# Patient Record
Sex: Male | Born: 1965 | Race: White | Hispanic: No | Marital: Married | Smoking: Never smoker
Health system: Southern US, Community
[De-identification: ages and names within clinical notes are randomized; demographics above are authoritative.]

## PROBLEM LIST (undated history)

## (undated) DIAGNOSIS — Z86718 Personal history of other venous thrombosis and embolism: Secondary | ICD-10-CM

## (undated) DIAGNOSIS — E7800 Pure hypercholesterolemia, unspecified: Secondary | ICD-10-CM

## (undated) DIAGNOSIS — I2510 Atherosclerotic heart disease of native coronary artery without angina pectoris: Secondary | ICD-10-CM

## (undated) HISTORY — PX: FOOT SURGERY: SHX648

## (undated) HISTORY — DX: Atherosclerotic heart disease of native coronary artery without angina pectoris: I25.10

---

## 2007-07-03 ENCOUNTER — Emergency Department (HOSPITAL_COMMUNITY): Admission: EM | Admit: 2007-07-03 | Discharge: 2007-07-03 | Payer: Self-pay | Admitting: Emergency Medicine

## 2007-07-03 ENCOUNTER — Encounter (INDEPENDENT_AMBULATORY_CARE_PROVIDER_SITE_OTHER): Payer: Self-pay | Admitting: Cardiology

## 2011-10-12 LAB — POCT CARDIAC MARKERS
CKMB, poc: <1 — ABNORMAL LOW
Myoglobin, poc: 43.2
Troponin i, poc: <0.05

## 2011-10-12 LAB — COMPREHENSIVE METABOLIC PANEL
ALT: 27
AST: 23
CO2: 22
Calcium: 9.3
GFR calc Af Amer: >60
Potassium: 3.6
Sodium: 138
Total Protein: 6.9

## 2011-10-12 LAB — POCT I-STAT CREATININE: Creatinine, Ser: 0.8

## 2011-10-12 LAB — I-STAT 8, (EC8 V) (CONVERTED LAB)
Acid-base deficit: 3 — ABNORMAL HIGH
Bicarbonate: 17.2 — ABNORMAL LOW
Chloride: 109
pCO2, Ven: 20.6 — ABNORMAL LOW
pH, Ven: 7.531 — ABNORMAL HIGH

## 2011-10-12 LAB — D-DIMER, QUANTITATIVE
D-Dimer, Quant: 0.37
D-Dimer, Quant: <0.22

## 2011-10-12 LAB — CBC
HCT: 44.5
MCHC: 34.2
MCV: 90.6
Platelets: 231
RDW: 12.4

## 2011-10-12 LAB — DIFFERENTIAL
Basophils Absolute: 0
Basophils Relative: 0
Eosinophils Absolute: 0.1
Eosinophils Relative: 1
Lymphs Abs: 1.9
Neutrophils Relative %: 66

## 2011-10-12 LAB — PROTIME-INR: INR: 0.9

## 2011-10-12 LAB — CK TOTAL AND CKMB (NOT AT ARMC)
Relative Index: INVALID
Total CK: 77

## 2011-10-12 LAB — SEDIMENTATION RATE: Sed Rate: 5

## 2013-03-07 ENCOUNTER — Ambulatory Visit: Payer: Self-pay | Admitting: Family Medicine

## 2013-03-07 LAB — CBC WITH DIFFERENTIAL/PLATELET
Eosinophil #: 0.1 10*3/uL (ref 0.0–0.7)
Lymphocyte #: 1.8 10*3/uL (ref 1.0–3.6)
MCH: 30.6 pg (ref 26.0–34.0)
Neutrophil #: 12.2 10*3/uL — ABNORMAL HIGH (ref 1.4–6.5)
Neutrophil %: 78.6 %
RBC: 4.49 10*6/uL (ref 4.40–5.90)

## 2013-03-07 LAB — URINALYSIS, COMPLETE
Ketone: NEGATIVE
Ph: 6 (ref 4.5–8.0)

## 2013-03-07 LAB — COMPREHENSIVE METABOLIC PANEL
Anion Gap: 11 (ref 7–16)
Bilirubin,Total: 0.8 mg/dL (ref 0.2–1.0)
Chloride: 99 mmol/L (ref 98–107)
Glucose: 108 mg/dL — ABNORMAL HIGH (ref 65–99)
Potassium: 3.3 mmol/L — ABNORMAL LOW (ref 3.5–5.1)
SGOT(AST): 43 U/L — ABNORMAL HIGH (ref 15–37)
SGPT (ALT): 87 U/L — ABNORMAL HIGH (ref 12–78)

## 2013-03-13 ENCOUNTER — Ambulatory Visit: Payer: Self-pay

## 2013-03-15 ENCOUNTER — Ambulatory Visit: Payer: Self-pay | Admitting: Urology

## 2013-03-22 ENCOUNTER — Ambulatory Visit: Payer: Self-pay

## 2013-03-28 ENCOUNTER — Ambulatory Visit: Payer: Self-pay

## 2013-08-31 ENCOUNTER — Telehealth: Payer: Self-pay | Admitting: Endocrinology

## 2017-04-11 ENCOUNTER — Encounter: Payer: Self-pay | Admitting: *Deleted

## 2017-04-11 ENCOUNTER — Observation Stay
Admission: EM | Admit: 2017-04-11 | Discharge: 2017-04-12 | Disposition: A | Discharge status: Home / Self Care | Payer: PRIVATE HEALTH INSURANCE | Attending: Internal Medicine | Admitting: Internal Medicine

## 2017-04-11 ENCOUNTER — Emergency Department: Payer: PRIVATE HEALTH INSURANCE

## 2017-04-11 DIAGNOSIS — K219 Gastro-esophageal reflux disease without esophagitis: Secondary | ICD-10-CM | POA: Insufficient documentation

## 2017-04-11 DIAGNOSIS — Z8249 Family history of ischemic heart disease and other diseases of the circulatory system: Secondary | ICD-10-CM | POA: Insufficient documentation

## 2017-04-11 DIAGNOSIS — E7800 Pure hypercholesterolemia, unspecified: Secondary | ICD-10-CM | POA: Diagnosis not present

## 2017-04-11 DIAGNOSIS — R079 Chest pain, unspecified: Secondary | ICD-10-CM

## 2017-04-11 DIAGNOSIS — E785 Hyperlipidemia, unspecified: Secondary | ICD-10-CM | POA: Diagnosis not present

## 2017-04-11 DIAGNOSIS — R0789 Other chest pain: Secondary | ICD-10-CM | POA: Diagnosis not present

## 2017-04-11 DIAGNOSIS — R202 Paresthesia of skin: Secondary | ICD-10-CM | POA: Insufficient documentation

## 2017-04-11 DIAGNOSIS — R002 Palpitations: Secondary | ICD-10-CM | POA: Diagnosis not present

## 2017-04-11 DIAGNOSIS — I2510 Atherosclerotic heart disease of native coronary artery without angina pectoris: Secondary | ICD-10-CM | POA: Diagnosis not present

## 2017-04-11 DIAGNOSIS — I200 Unstable angina: Secondary | ICD-10-CM

## 2017-04-11 DIAGNOSIS — Z823 Family history of stroke: Secondary | ICD-10-CM | POA: Insufficient documentation

## 2017-04-11 DIAGNOSIS — E782 Mixed hyperlipidemia: Secondary | ICD-10-CM

## 2017-04-11 HISTORY — DX: Pure hypercholesterolemia, unspecified: E78.00

## 2017-04-11 LAB — BASIC METABOLIC PANEL
ANION GAP: 7 (ref 5–15)
BUN: 13 mg/dL (ref 6–20)
CALCIUM: 9.3 mg/dL (ref 8.9–10.3)
CO2: 23 mmol/L (ref 22–32)
Chloride: 106 mmol/L (ref 101–111)
Creatinine, Ser: 0.84 mg/dL (ref 0.61–1.24)
GFR calc Af Amer: >60 mL/min (ref >60)
GLUCOSE: 97 mg/dL (ref 65–99)
Potassium: 4.3 mmol/L (ref 3.5–5.1)
SODIUM: 136 mmol/L (ref 135–145)

## 2017-04-11 LAB — CBC
HCT: 45 % (ref 40.0–52.0)
HEMOGLOBIN: 15.4 g/dL (ref 13.0–18.0)
MCH: 30.8 pg (ref 26.0–34.0)
MCHC: 34.3 g/dL (ref 32.0–36.0)
MCV: 90 fL (ref 80.0–100.0)
Platelets: 228 10*3/uL (ref 150–440)
RBC: 5.01 MIL/uL (ref 4.40–5.90)
RDW: 13.2 % (ref 11.5–14.5)
WBC: 7.5 10*3/uL (ref 3.8–10.6)

## 2017-04-11 LAB — PROTIME-INR
INR: 0.89
PROTHROMBIN TIME: 12 s (ref 11.4–15.2)

## 2017-04-11 LAB — TROPONIN I: Troponin I: <0.03 ng/mL (ref <0.03)

## 2017-04-11 LAB — APTT: APTT: 29 s (ref 24–36)

## 2017-04-11 LAB — HEPARIN LEVEL (UNFRACTIONATED): HEPARIN UNFRACTIONATED: 0.17 [IU]/mL — AB (ref 0.30–0.70)

## 2017-04-11 MED ORDER — SODIUM CHLORIDE 0.9% FLUSH: 3.0000 mL | Freq: Two times a day (BID) | INTRAVENOUS | Status: DC

## 2017-04-11 MED ORDER — BISACODYL 5 MG PO TBEC: 5.0000 mg | DELAYED_RELEASE_TABLET | Freq: Every day | ORAL | Status: DC | PRN

## 2017-04-11 MED ORDER — SODIUM CHLORIDE 0.9 % IV BOLUS (SEPSIS)
1000.0000 mL | Freq: Once | INTRAVENOUS | Status: AC
  Administered 2017-04-11: 1000 mL via INTRAVENOUS

## 2017-04-11 MED ORDER — HEPARIN BOLUS VIA INFUSION
2700.0000 [IU] | Freq: Once | INTRAVENOUS | Status: AC
  Administered 2017-04-11: 2700 [IU] via INTRAVENOUS
  Filled 2017-04-11: qty 2700

## 2017-04-11 MED ORDER — HEPARIN BOLUS VIA INFUSION
4000.0000 [IU] | Freq: Once | INTRAVENOUS | Status: AC
  Administered 2017-04-11: 4000 [IU] via INTRAVENOUS
  Filled 2017-04-11: qty 4000

## 2017-04-11 MED ORDER — ASPIRIN EC 325 MG PO TBEC: 325.0000 mg | DELAYED_RELEASE_TABLET | Freq: Every day | ORAL | Status: DC

## 2017-04-11 MED ORDER — NITROGLYCERIN 0.4 MG SL SUBL
0.4000 mg | SUBLINGUAL_TABLET | SUBLINGUAL | Status: DC | PRN
  Administered 2017-04-11: 0.4 mg via SUBLINGUAL
  Filled 2017-04-11: qty 3

## 2017-04-11 MED ORDER — ONDANSETRON HCL 4 MG/2ML IJ SOLN: 4.0000 mg | Freq: Four times a day (QID) | INTRAMUSCULAR | Status: DC | PRN

## 2017-04-11 MED ORDER — METOPROLOL TARTRATE 25 MG PO TABS
12.5000 mg | ORAL_TABLET | Freq: Two times a day (BID) | ORAL | Status: DC
  Administered 2017-04-11 – 2017-04-12 (×2): 12.5 mg via ORAL
  Filled 2017-04-11 (×2): qty 1

## 2017-04-11 MED ORDER — HYDROCODONE-ACETAMINOPHEN 5-325 MG PO TABS: 1.0000 | ORAL_TABLET | ORAL | Status: DC | PRN

## 2017-04-11 MED ORDER — ACETAMINOPHEN 650 MG RE SUPP: 650.0000 mg | Freq: Four times a day (QID) | RECTAL | Status: DC | PRN

## 2017-04-11 MED ORDER — ACETAMINOPHEN 325 MG PO TABS
650.0000 mg | ORAL_TABLET | Freq: Four times a day (QID) | ORAL | Status: DC | PRN
  Administered 2017-04-12: 650 mg via ORAL

## 2017-04-11 MED ORDER — SENNOSIDES-DOCUSATE SODIUM 8.6-50 MG PO TABS: 1.0000 | ORAL_TABLET | Freq: Every evening | ORAL | Status: DC | PRN

## 2017-04-11 MED ORDER — HEPARIN (PORCINE) IN NACL 100-0.45 UNIT/ML-% IJ SOLN
1350.0000 [IU]/h | INTRAMUSCULAR | Status: DC
  Administered 2017-04-11: 1000 [IU]/h via INTRAVENOUS
  Filled 2017-04-11 (×2): qty 250

## 2017-04-11 MED ORDER — ONDANSETRON HCL 4 MG PO TABS: 4.0000 mg | ORAL_TABLET | Freq: Four times a day (QID) | ORAL | Status: DC | PRN

## 2017-04-11 MED ORDER — ENOXAPARIN SODIUM 40 MG/0.4ML ~~LOC~~ SOLN: 40.0000 mg | SUBCUTANEOUS | Status: DC

## 2017-04-11 MED ORDER — ASPIRIN 81 MG PO CHEW
324.0000 mg | CHEWABLE_TABLET | Freq: Once | ORAL | Status: AC
  Administered 2017-04-11: 324 mg via ORAL
  Filled 2017-04-11: qty 4

## 2017-04-11 MED ORDER — SIMVASTATIN 10 MG PO TABS: 20.0000 mg | ORAL_TABLET | Freq: Every day | ORAL | Status: DC

## 2017-04-11 MED ORDER — ROSUVASTATIN CALCIUM 10 MG PO TABS
10.0000 mg | ORAL_TABLET | Freq: Every day | ORAL | Status: DC
  Administered 2017-04-11 – 2017-04-12 (×2): 10 mg via ORAL
  Filled 2017-04-11 (×3): qty 1

## 2017-04-11 NOTE — Consult Note (Signed)
CARDIOLOGY CONSULT NOTE  Patient ID: Albert Burton MRN: [id number] DOB/AGE: Mar 09, 1966 51 y.o.  Admit date: 04/11/2017 Requesting Physician:  Dr. Juliene Pina Primary Physician: Mickey Farber, MD Primary Cardiologist : new Reason for Consultation unstable angina  Date:  04/11/2017    Chief Complaint  Patient presents with  . Chest Pain      History of Present Illness: Albert Burton is a 51 y.o. male who  Presented with chest pain at rest radiating to his left arm. The patient reports having cardiac catheterization more than 10 years ago at [facility] which showed 30% stenosis in one of his arteries. I could not find these records. Otherwise he is relatively healthy with only GERD and hyperlipidemia not on treatment. He is a lifelong nonsmoker. He does have family history of coronary artery disease. Over the last few months, he has experienced intermittent episodes of palpitations and tachycardia that usually causes him to wake up in the early morning hours as if he is having a nightmare. He had an episode this morning that was subsequently associated with left-sided chest heaviness and tightness with radiation to his left arm. This persisted for hours and currently he continues to have some left arm numbness and mild chest tightness. He is not aware of prior history of arrhythmia. He describes mild exertional dyspnea. His EKG showed sinus rhythm with left anterior fascicular block and nonspecific ST changes. His troponin has been negative. He is currently on heparin drip. He reports significant improvement in symptoms after he was given sublingual nitroglycerin.    Past Medical History:  Diagnosis Date  . High cholesterol     History reviewed. No pertinent surgical history.   Current Facility-Administered Medications  Medication Dose Route Frequency Provider Last Rate Last Dose  . acetaminophen (TYLENOL) tablet 650 mg  650 mg Oral Q6H PRN Adrian Saran, MD       Or  .  acetaminophen (TYLENOL) suppository 650 mg  650 mg Rectal Q6H PRN Adrian Saran, MD      . Melene Muller ON 04/12/2017] aspirin EC tablet 325 mg  325 mg Oral Daily Sital Mody, MD      . bisacodyl (DULCOLAX) EC tablet 5 mg  5 mg Oral Daily PRN Adrian Saran, MD      . heparin ADULT infusion 100 units/mL (25000 units/210mL sodium chloride 0.45%)  1,000 Units/hr Intravenous Continuous Adrian Saran, MD 10 mL/hr at 04/11/17 1501 1,000 Units/hr at 04/11/17 1501  . HYDROcodone-acetaminophen (NORCO/VICODIN) 5-325 MG per tablet 1-2 tablet  1-2 tablet Oral Q4H PRN Adrian Saran, MD      . metoprolol tartrate (LOPRESSOR) tablet 12.5 mg  12.5 mg Oral BID Sital Mody, MD      . nitroGLYCERIN (NITROSTAT) SL tablet 0.4 mg  0.4 mg Sublingual Q5 min PRN Nita Sickle, MD   0.4 mg at 04/11/17 1204  . ondansetron (ZOFRAN) tablet 4 mg  4 mg Oral Q6H PRN Adrian Saran, MD       Or  . ondansetron (ZOFRAN) injection 4 mg  4 mg Intravenous Q6H PRN Sital Mody, MD      . rosuvastatin (CRESTOR) tablet 10 mg  10 mg Oral Daily Adrian Saran, MD   10 mg at 04/11/17 1654  . senna-docusate (Senokot-S) tablet 1 tablet  1 tablet Oral QHS PRN Sital Mody, MD      . sodium chloride flush (NS) 0.9 % injection 3 mL  3 mL Intravenous Q12H Adrian Saran, MD  Allergies:   Patient has no known allergies.    Social History:  The patient  reports that he has never smoked. He has never used smokeless tobacco. He reports that he does not drink alcohol.   Family History:  The patient's remarkable for coronary artery disease and stroke.   ROS:  Please see the history of present illness.   Otherwise, review of systems are positive for none.   All other systems are reviewed and negative.    PHYSICAL EXAM: VS:  BP 135/73 (BP Location: Left Arm)   Pulse (!) 59   Temp 97.8 F (36.6 C) (Oral)   Resp 16   Ht  (1.854 m)   Wt 211 lb 12.8 oz (96.1 kg)   SpO2 100%   BMI 27.94 kg/m  , BMI Body mass index is 27.94 kg/m. GEN: Well nourished, well  developed, in no acute distress  HEENT: normal  Neck: no JVD, carotid bruits, or masses Cardiac: RRR; no murmurs, rubs, or gallops,no edema  Respiratory:  clear to auscultation bilaterally, normal work of breathing GI: soft, nontender, nondistended, + BS MS: no deformity or atrophy  Skin: warm and dry, no rash Neuro:  Strength and sensation are intact Psych: euthymic mood, full affect   EKG:   Personally reviewed by me and showed: Normal sinus rhythm with left anterior fascicular block and nonspecific ST changes. Telemetry recently reviewed by me and showed: Normal sinus rhythm.  Recent Labs: 04/11/2017: BUN 13; Creatinine, Ser 0.84; Hemoglobin 15.4; Platelets 228; Potassium 4.3; Sodium 136    Lipid Panel No results found for: CHOL, TRIG, HDL, CHOLHDL, VLDL, LDLCALC, LDLDIRECT    Wt Readings from Last 3 Encounters:  04/11/17 211 lb 12.8 oz (96.1 kg)        ASSESSMENT AND PLAN:  1.  Unstable angina: The patient's symptoms are highly worrisome for unstable angina with some nonspecific EKG changes. He also continues to have mild chest discomfort radiating to his left arm. I discussed with him different management options including proceeding with stress testing in the morning as long as enzymes remain negative versus proceeding directly with cardiac catheterization. I favor the second given his continued symptoms and I discussed the procedure in details with him as well as risks and benefits. He understands and wants to proceed with cardiac catheterization. Further recommendations to follow after cardiac cath. In the meanwhile, continue treatment with aspirin, unfractionated heparin and rosuvastatin.  2. Nighttime palpitations: If cardiac catheterization does not show obstructive disease, we should consider 30 day outpatient telemetry. Some of his nighttime symptoms might be triggered by GERD or undiagnosed sleep apnea. He denies symptoms of sleep apnea though.  Signed,  Lorine Bears, MD  04/11/2017 5:38 PM    [facility]

## 2017-04-11 NOTE — ED Notes (Signed)
Nitro relieved pt's pain, states he is still having some numbness to left arm but less severe.  MD notified.

## 2017-04-11 NOTE — ED Notes (Signed)
Patient transported to CT 

## 2017-04-11 NOTE — Progress Notes (Signed)
ANTICOAGULATION CONSULT NOTE - Initial Consult  Pharmacy Consult for Heparin Drip Indication: chest pain/ACS  No Known Allergies  Patient Measurements: Height:  (185.4 cm) Weight: 200 lb (90.7 kg) IBW/kg (Calculated) : 79.9 Heparin Dosing Weight: 90.7 kg  Vital Signs: Temp: 98.2 F (36.8 C) (04/16 1040) Temp Source: Oral (04/16 1040) BP: 109/80 (04/16 1300) Pulse Rate: 55 (04/16 1300)  Labs:  Recent Labs  04/11/17 1038  HGB 15.4  HCT 45.0  PLT 228  CREATININE 0.84  TROPONINI <0.03    Estimated Creatinine Clearance: 121.5 mL/min (by C-G formula based on SCr of 0.84 mg/dL).   Medical History: Past Medical History:  Diagnosis Date  . High cholesterol     Medications:  Infusions:  . heparin      Assessment: 51 yo male with chest pain relieved with NTG.  Goal of Therapy:  Heparin level 0.3-0.7 units/ml Monitor platelets by anticoagulation protocol: Yes   Plan:  Give 4000 units bolus x 1 Start heparin infusion at 1000 units/hr Check anti-Xa level in 6 hours and daily while on heparin Continue to monitor H&H and platelets  Jillann Charette K 04/11/2017,1:57 PM

## 2017-04-11 NOTE — Progress Notes (Addendum)
ANTICOAGULATION CONSULT NOTE - Initial Consult  Pharmacy Consult for Heparin Drip Indication: chest pain/ACS  No Known Allergies  Patient Measurements: Height:  (185.4 cm) Weight: 211 lb 12.8 oz (96.1 kg) IBW/kg (Calculated) : 79.9 Heparin Dosing Weight: 90.7 kg  Vital Signs: Temp: 98.1 F (36.7 C) (04/16 1942) Temp Source: Oral (04/16 1942) BP: 132/77 (04/16 1942) Pulse Rate: 60 (04/16 1942)  Labs:  Recent Labs  04/11/17 1038 04/11/17 1440 04/11/17 1626 04/11/17 2132  HGB 15.4  --   --   --   HCT 45.0  --   --   --   PLT 228  --   --   --   APTT  --  29  --   --   LABPROT  --  12.0  --   --   INR  --  0.89  --   --   HEPARINUNFRC  --   --   --  0.17*  CREATININE 0.84  --   --   --   TROPONINI <0.03  --  <0.03  --     Estimated Creatinine Clearance: 131.4 mL/min (by C-G formula based on SCr of 0.84 mg/dL).   Medical History: Past Medical History:  Diagnosis Date  . High cholesterol     Medications:  Infusions:  . heparin 1,000 Units/hr (04/11/17 1501)    Assessment: 51 yo male with chest pain relieved with NTG.  Goal of Therapy:  Heparin level 0.3-0.7 units/ml Monitor platelets by anticoagulation protocol: Yes   Plan:  Give 4000 units bolus x 1 Start heparin infusion at 1000 units/hr Check anti-Xa level in 6 hours and daily while on heparin Continue to monitor H&H and platelets   4/16 21:30 heparin level 0.17. 2700 unit bolus and increase rate to 1350 units/hr. Recheck heparin level and CBC with tomorrow AM labs.  4/17 AM heparin level 0.54. Recheck in 6 hours to confirm.  Albert Burton 04/11/2017,10:15 PM

## 2017-04-11 NOTE — ED Triage Notes (Signed)
States left sided chest pain that radiates under her left breast and down his left arm, states the pain woke him up in the middle of the night, states intermittent episodes of same for the past month

## 2017-04-11 NOTE — Progress Notes (Signed)
50yo wm admitted to room 231 via stretcher from ED with chest pain.  A&O x 3, no distress on ra.  Cardiac monitor placed on pt and verified with [facility] CNA, pt denies chest pain at this time but still has some numbness to left arm.  Lungs clear bil.  SL rt hand flushes well.  Heparin gtt infusing at 10u/hr in rt fa.  Skin intact and checked with Tammy RN.  Oriented to room and surroundings, POC reviewed with pt and wife.  Denies need at this time. CB in reach, SR up x2.

## 2017-04-11 NOTE — H&P (Signed)
[facility] - [place] at [facility]   PATIENT NAME: Albert Burton    MR#:  [id number]  DATE OF BIRTH:  February 10, 1966  DATE OF ADMISSION:  04/11/2017  PRIMARY CARE PHYSICIAN: Mickey Farber, MD   REQUESTING/REFERRING PHYSICIAN: *Dr Don Perking  CHIEF COMPLAINT:   Chest pain HISTORY OF PRESENT ILLNESS:  Albert Burton  is a 51 y.o. male with a known history of Hyperlipidemia not on medications yet who presents with above complaint. Patient reports at 3:30 this a.m. he will cup with palpitations and left sided chest pain under his left armpit which radiated to his left arm with a sensation of numbness. He went to work despite this ongoing symptoms and at approximately 9:30 AM he called his wife and major to ER for further evaluation. He reports that the pain was a 3 out of 4 and is constant. This is now relieved with nitroglycerin. He denies any exacerbating factors. He reports in the past several months he has had similar episodes. He reports approximately 10 years ago he underwent cardiac catheterization for similar symptoms in which they found that one of the arteries had 30% blockage.   PAST MEDICAL HISTORY:   Past Medical History:  Diagnosis Date  . High cholesterol     PAST SURGICAL HISTORY:  None  SOCIAL HISTORY:   Social History  Substance Use Topics  . Smoking status: Never Smoker  . Smokeless tobacco: Never Used  . Alcohol use No    FAMILY HISTORY:  Grandfather died age 71 after suffering 3 heart attacks  DRUG ALLERGIES:  No Known Allergies  REVIEW OF SYSTEMS:   Review of Systems  Constitutional: Negative.  Negative for chills, fever and malaise/fatigue.  HENT: Negative.  Negative for ear discharge, ear pain, hearing loss, nosebleeds and sore throat.   Eyes: Negative.  Negative for blurred vision and pain.  Respiratory: Negative.  Negative for cough, hemoptysis, shortness of breath and wheezing.   Cardiovascular: Positive for chest pain and  palpitations. Negative for leg swelling.  Gastrointestinal: Negative.  Negative for abdominal pain, blood in stool, diarrhea, nausea and vomiting.  Genitourinary: Negative.  Negative for dysuria.  Musculoskeletal: Negative.  Negative for back pain.  Skin: Negative.   Neurological: Positive for sensory change. Negative for dizziness, tremors, speech change, focal weakness, seizures and headaches.  Endo/Heme/Allergies: Negative.  Does not bruise/bleed easily.  Psychiatric/Behavioral: Negative.  Negative for depression, hallucinations and suicidal ideas.    MEDICATIONS AT HOME:   Prior to Admission medications   Medication Sig Start Date End Date Taking? Authorizing Provider  ranitidine (ZANTAC) 150 MG tablet Take 150 mg by mouth daily.   Yes Historical Provider, MD      VITAL SIGNS:  Blood pressure 109/80, pulse (!) 55, temperature 98.2 F (36.8 C), temperature source Oral, resp. rate (!) 21, height  (1.854 m), weight 90.7 kg (200 lb), SpO2 96 %.  PHYSICAL EXAMINATION:   Physical Exam  Constitutional: He is oriented to person, place, and time and well-developed, well-nourished, and in no distress. No distress.  HENT:  Head: Normocephalic.  Eyes: No scleral icterus.  Neck: Normal range of motion. Neck supple. No JVD present. No tracheal deviation present.  Cardiovascular: Normal rate, regular rhythm and normal heart sounds.  Exam reveals no gallop and no friction rub.   No murmur heard. Pulmonary/Chest: Effort normal and breath sounds normal. No respiratory distress. He has no wheezes. He has no rales. He exhibits no tenderness.  Abdominal: Soft. Bowel sounds are normal.  He exhibits no distension and no mass. There is no tenderness. There is no rebound and no guarding.  Musculoskeletal: Normal range of motion. He exhibits no edema.  Neurological: He is alert and oriented to person, place, and time.  Skin: Skin is warm. No rash noted. No erythema.  Psychiatric: Affect and  judgment normal.      LABORATORY PANEL:   CBC  Recent Labs Lab 04/11/17 1038  WBC 7.5  HGB 15.4  HCT 45.0  PLT 228   ------------------------------------------------------------------------------------------------------------------  Chemistries   Recent Labs Lab 04/11/17 1038  NA 136  K 4.3  CL 106  CO2 23  GLUCOSE 97  BUN 13  CREATININE 0.84  CALCIUM 9.3   ------------------------------------------------------------------------------------------------------------------  Cardiac Enzymes  Recent Labs Lab 04/11/17 1038  TROPONINI <0.03   ------------------------------------------------------------------------------------------------------------------  RADIOLOGY:  Dg Chest 2 View  Result Date: 04/11/2017 CLINICAL DATA:  States left sided chest pain that radiates under her left breast and down his left arm, states the pain woke him up in the middle of the night, states intermittent episodes of same for the past month. Never a smoker EXAM: CHEST  2 VIEW COMPARISON:  07/02/2017 FINDINGS: The heart size and mediastinal contours are within normal limits. Both lungs are clear. The visualized skeletal structures are unremarkable. IMPRESSION: No active cardiopulmonary disease. Electronically Signed   By: Elige Ko   On: 04/11/2017 11:13   Ct Head Wo Contrast  Result Date: 04/11/2017 CLINICAL DATA:  51 year old male with history of chest pain and left upper extremity numbness this morning. Difficulty finding words. EXAM: CT HEAD WITHOUT CONTRAST TECHNIQUE: Contiguous axial images were obtained from the base of the skull through the vertex without intravenous contrast. COMPARISON:  No priors. FINDINGS: Brain: No evidence of acute infarction, hemorrhage, hydrocephalus, extra-axial collection or mass lesion/mass effect. Vascular: No hyperdense vessel or unexpected calcification. Skull: Normal. Negative for fracture or focal lesion. Sinuses/Orbits: No acute finding. Other: None.  IMPRESSION: 1. No acute intracranial abnormalities. 2. Normal appearance of the brain. Electronically Signed   By: Trudie Reed M.D.   On: 04/11/2017 12:56    EKG:  Sinus rhythm no ST elevation or depression  IMPRESSION AND PLAN:   51 year old male with history of hyperlipidemia currently diet controlled who presents with unstable angina.  1. Unstable angina: Patient has typical symptoms which were relieved with nitroglycerin. Continue monitoring on telemetry. Continue troponins. Stress test in a.m. if troponins are negative. Consult cardiology. Full dose heparin gtt, ASA, statin. Check lipid panel and hemoglobin A1c.  2/ HLD: Diet controlled. Will start statin due to #1. Check lipid panel    All the records are reviewed and case discussed with ED provider. Management plans discussed with the patient and he is in agreement  CODE STATUS: FULL  TOTAL TIME TAKING CARE OF THIS PATIENT: 50 minutes.    Albert Burton M.D on 04/11/2017 at 1:40 PM  Between 7am to 6pm - Pager - [phone]  After 6pm go to [email or web address] - password [email or web address]  [facility] [place] [facility]  Office  [phone]  CC: Primary care physician; Mickey Farber, MD

## 2017-04-11 NOTE — Plan of Care (Signed)
Problem: Safety: Goal: Ability to remain free from injury will improve Outcome: Progressing Fall precautions in place, non skid socks when out of bed  Problem: Pain Managment: Goal: General experience of comfort will improve Outcome: Progressing Prn medications  Problem: Tissue Perfusion: Goal: Risk factors for ineffective tissue perfusion will decrease Outcome: Progressing Heparin gtt  Problem: Activity: Goal: Ability to tolerate increased activity will improve Outcome: Progressing Possible stress test tomorrow  Problem: Education: Goal: Understanding of cardiac disease, CV risk reduction, and recovery process will improve Outcome: Progressing Heparin gtt infusing

## 2017-04-11 NOTE — ED Provider Notes (Signed)
[facility] Emergency Department Provider Note  ____________________________________________  Time seen: Approximately 12:14 PM  I have reviewed the triage vital signs and the nursing notes.   HISTORY  Chief Complaint Chest Pain   HPI Albert Burton is a 51 y.o. male with no significant past medical history who presents for evaluation of left arm numbness. Patient reports that he woke up at 3 AM this morning feeling palpitations. He had difficulty finding words and developed a dull pain in his left armpit associated with numbness of his left arm. The palpitations have resolved however the numbness has persisted. Patient also reports that his difficulty finding words has resolved. He has strong family history of ischemic heart disease and strokes. Patient denies chest pain, diaphoresis, shortness of breath, lightheadedness, back pain, nausea, vomiting. Also denies numbness of his left lower extremity or facial droop. Patient does not take any medications at home. Patient reports no history of chest pain with exertion. No history of smoking.  Past Medical History:  Diagnosis Date  . High cholesterol     There are no active problems to display for this patient.   History reviewed. No pertinent surgical history.  Prior to Admission medications   Not on File    Allergies Patient has no known allergies.  No family history on file.  Social History Social History  Substance Use Topics  . Smoking status: Never Smoker  . Smokeless tobacco: Never Used  . Alcohol use No    Review of Systems  Constitutional: Negative for fever. Eyes: Negative for visual changes. ENT: Negative for sore throat. Neck: No neck pain  Cardiovascular: Negative for chest pain. Respiratory: Negative for shortness of breath. Gastrointestinal: Negative for abdominal pain, vomiting or diarrhea. Genitourinary: Negative for dysuria. Musculoskeletal: Negative for back  pain. Skin: Negative for rash. Neurological: Negative for headaches. + LUE pain numbness and difficulty finding words Psych: No SI or HI  ____________________________________________   PHYSICAL EXAM:  VITAL SIGNS: ED Triage Vitals  Enc Vitals Group     BP 04/11/17 1040 (!) 136/97     Pulse Rate 04/11/17 1040 72     Resp 04/11/17 1040 18     Temp 04/11/17 1040 98.2 F (36.8 C)     Temp Source 04/11/17 1040 Oral     SpO2 04/11/17 1040 99 %     Weight 04/11/17 1040 200 lb (90.7 kg)     Height 04/11/17 1040  (1.854 m)     Head Circumference --      Peak Flow --      Pain Score 04/11/17 1039 6     Pain Loc --      Pain Edu? --      Excl. in GC? --     Constitutional: Alert and oriented. Well appearing and in no apparent distress. HEENT:      Head: Normocephalic and atraumatic.         Eyes: Conjunctivae are normal. Sclera is non-icteric. EOMI. PERRL      Mouth/Throat: Mucous membranes are moist.       Neck: Supple with no signs of meningismus. Cardiovascular: Regular rate and rhythm. No murmurs, gallops, or rubs. 2+ symmetrical distal pulses are present in all extremities. No JVD. Respiratory: Normal respiratory effort. Lungs are clear to auscultation bilaterally. No wheezes, crackles, or rhonchi.  Gastrointestinal: Soft, non tender, and non distended with positive bowel sounds. No rebound or guarding. Musculoskeletal: Nontender with normal range of motion in all extremities.  No edema, cyanosis, or erythema of extremities. Neurologic: Normal speech and language. A & O x3, PERRL, no nystagmus, CN II-XII intact, motor testing reveals good tone and bulk throughout. There is no evidence of pronator drift or dysmetria. Muscle strength is 5/5 throughout. Deep tendon reflexes are 2+ throughout with downgoing toes. Sensory examination is intact. Gait is normal. Skin: Skin is warm, dry and intact. No rash noted. Psychiatric: Mood and affect are normal. Speech and behavior are  normal.  ____________________________________________   LABS (all labs ordered are listed, but only abnormal results are displayed)  Labs Reviewed  BASIC METABOLIC PANEL  CBC  TROPONIN I   ____________________________________________  EKG  ED ECG REPORT I, Nita Sickle, the attending physician, personally viewed and interpreted this ECG.  10:37 - normal sinus rhythm, rate of 70, normal intervals, left axis deviation, no ST elevations or depressions.  12:15 - normal sinus rhythm, rate of 61, normal intervals, T wave inversion on lead III, no STE.  ____________________________________________  RADIOLOGY  CXR: Negative  Head CT: negative ____________________________________________   PROCEDURES  Procedure(s) performed: None Procedures Critical Care performed:  None ____________________________________________   INITIAL IMPRESSION / ASSESSMENT AND PLAN / ED COURSE   51 y.o. male with no significant past medical history who presents for evaluation of left armpit pain and left arm numbness since 3AM this morning. Patient is well-appearing, in no distress, initial EKG with no evidence of ischemia. Neurologically intact. Patient was given one sublingual nitroglycerin with full resolution of his symptoms makes me concerned for ACS. however since patient mentioned difficulty finding words with the onset of symptoms at 3 AM this could also be a stroke or TIA. Outside of TPA window. Second EKG with no ischemic changes. First troponin is negative. We'll get a head CT and if that's also negative for hemorrhagic stroke given aspirin and admitted to the hospitalist service.  Clinical Course as of Apr 12 1315  Mon Apr 11, 2017  1315 CT negative. Patient remains pain free after one sublingual nitro. Will admit to Hospitalist for CP workup  [CV]    Clinical Course User Index [CV] Nita Sickle, MD    Pertinent labs & imaging results that were available during my care of the  patient were reviewed by me and considered in my medical decision making (see chart for details).    ____________________________________________   FINAL CLINICAL IMPRESSION(S) / ED DIAGNOSES  Final diagnoses:  Chest pain, unspecified type      NEW MEDICATIONS STARTED DURING THIS VISIT:  New Prescriptions   No medications on file     Note:  This document was prepared using Dragon voice recognition software and may include unintentional dictation errors.    Nita Sickle, MD 04/11/17 1316

## 2017-04-12 ENCOUNTER — Encounter
Admission: EM | Disposition: A | Discharge status: Home / Self Care | Payer: Self-pay | Source: Home / Self Care | Attending: Emergency Medicine

## 2017-04-12 ENCOUNTER — Telehealth: Payer: Self-pay | Admitting: Internal Medicine

## 2017-04-12 DIAGNOSIS — I25110 Atherosclerotic heart disease of native coronary artery with unstable angina pectoris: Secondary | ICD-10-CM

## 2017-04-12 DIAGNOSIS — R002 Palpitations: Secondary | ICD-10-CM | POA: Diagnosis not present

## 2017-04-12 DIAGNOSIS — E782 Mixed hyperlipidemia: Secondary | ICD-10-CM

## 2017-04-12 DIAGNOSIS — I200 Unstable angina: Secondary | ICD-10-CM

## 2017-04-12 HISTORY — PX: LEFT HEART CATH AND CORONARY ANGIOGRAPHY: CATH118249

## 2017-04-12 LAB — HIV ANTIBODY (ROUTINE TESTING W REFLEX): HIV SCREEN 4TH GENERATION: NONREACTIVE

## 2017-04-12 LAB — CREATININE, SERUM
Creatinine, Ser: 0.64 mg/dL (ref 0.61–1.24)
GFR calc Af Amer: >60 mL/min (ref >60)

## 2017-04-12 LAB — CBC
HCT: 43.7 % (ref 40.0–52.0)
Hemoglobin: 14.8 g/dL (ref 13.0–18.0)
MCH: 30.6 pg (ref 26.0–34.0)
MCHC: 33.9 g/dL (ref 32.0–36.0)
MCV: 90.2 fL (ref 80.0–100.0)
Platelets: 220 10*3/uL (ref 150–440)
RBC: 4.84 MIL/uL (ref 4.40–5.90)
RDW: 13.6 % (ref 11.5–14.5)
WBC: 7.9 10*3/uL (ref 3.8–10.6)

## 2017-04-12 LAB — LIPID PANEL
CHOL/HDL RATIO: 6.1 ratio
Cholesterol: 249 mg/dL — ABNORMAL HIGH (ref 0–200)
HDL: 41 mg/dL (ref >40)
LDL CALC: 170 mg/dL — AB (ref 0–99)
Triglycerides: 191 mg/dL — ABNORMAL HIGH (ref <150)
VLDL: 38 mg/dL (ref 0–40)

## 2017-04-12 LAB — HEMOGLOBIN A1C
Hgb A1c MFr Bld: 5.2 % (ref 4.8–5.6)
MEAN PLASMA GLUCOSE: 103 mg/dL

## 2017-04-12 LAB — TROPONIN I

## 2017-04-12 LAB — HEPARIN LEVEL (UNFRACTIONATED): Heparin Unfractionated: 0.54 IU/mL (ref 0.30–0.70)

## 2017-04-12 SURGERY — LEFT HEART CATH AND CORONARY ANGIOGRAPHY
Anesthesia: Moderate Sedation

## 2017-04-12 MED ORDER — ASPIRIN 81 MG PO CHEW
81.0000 mg | CHEWABLE_TABLET | Freq: Once | ORAL | Status: AC
  Administered 2017-04-12: 81 mg via ORAL
  Filled 2017-04-12: qty 1

## 2017-04-12 MED ORDER — LIDOCAINE HCL (PF) 1 % IJ SOLN
INTRAMUSCULAR | Status: DC | PRN
  Administered 2017-04-12: 3 mL

## 2017-04-12 MED ORDER — SODIUM CHLORIDE 0.9% FLUSH: 3.0000 mL | INTRAVENOUS | Status: DC | PRN

## 2017-04-12 MED ORDER — ACETAMINOPHEN 325 MG PO TABS
ORAL_TABLET | ORAL | Status: AC
  Administered 2017-04-12: 650 mg via ORAL
  Filled 2017-04-12: qty 2

## 2017-04-12 MED ORDER — LIDOCAINE HCL (PF) 1 % IJ SOLN
INTRAMUSCULAR | Status: AC
  Filled 2017-04-12: qty 30

## 2017-04-12 MED ORDER — METOPROLOL TARTRATE 25 MG PO TABS: 12.5000 mg | ORAL_TABLET | Freq: Two times a day (BID) | ORAL | 0 refills | Status: DC

## 2017-04-12 MED ORDER — ENOXAPARIN SODIUM 40 MG/0.4ML ~~LOC~~ SOLN: 40.0000 mg | SUBCUTANEOUS | Status: DC

## 2017-04-12 MED ORDER — SODIUM CHLORIDE 0.9 % IV SOLN
INTRAVENOUS | Status: DC
  Administered 2017-04-12: 10:00:00 via INTRAVENOUS

## 2017-04-12 MED ORDER — IOPAMIDOL (ISOVUE-300) INJECTION 61%
INTRAVENOUS | Status: DC | PRN
  Administered 2017-04-12: 90 mL via INTRA_ARTERIAL

## 2017-04-12 MED ORDER — MIDAZOLAM HCL 2 MG/2ML IJ SOLN
INTRAMUSCULAR | Status: AC
  Filled 2017-04-12: qty 2

## 2017-04-12 MED ORDER — SODIUM CHLORIDE 0.9 % IV SOLN: 250.0000 mL | INTRAVENOUS | Status: DC | PRN

## 2017-04-12 MED ORDER — ROSUVASTATIN CALCIUM 10 MG PO TABS: 10.0000 mg | ORAL_TABLET | Freq: Every day | ORAL | 1 refills | Status: AC

## 2017-04-12 MED ORDER — FENTANYL CITRATE (PF) 100 MCG/2ML IJ SOLN
INTRAMUSCULAR | Status: DC | PRN
  Administered 2017-04-12: 50 ug via INTRAVENOUS

## 2017-04-12 MED ORDER — HEPARIN (PORCINE) IN NACL 2-0.9 UNIT/ML-% IJ SOLN
INTRAMUSCULAR | Status: AC
  Filled 2017-04-12: qty 500

## 2017-04-12 MED ORDER — FAMOTIDINE 20 MG PO TABS
20.0000 mg | ORAL_TABLET | Freq: Every day | ORAL | Status: DC
  Administered 2017-04-12: 20 mg via ORAL
  Filled 2017-04-12: qty 1

## 2017-04-12 MED ORDER — VERAPAMIL HCL 2.5 MG/ML IV SOLN
INTRAVENOUS | Status: AC
  Filled 2017-04-12: qty 2

## 2017-04-12 MED ORDER — FENTANYL CITRATE (PF) 100 MCG/2ML IJ SOLN
INTRAMUSCULAR | Status: AC
  Filled 2017-04-12: qty 2

## 2017-04-12 MED ORDER — ASPIRIN 81 MG PO CHEW: 81.0000 mg | CHEWABLE_TABLET | Freq: Every day | ORAL | 1 refills | Status: DC

## 2017-04-12 MED ORDER — VERAPAMIL HCL 2.5 MG/ML IV SOLN
INTRAVENOUS | Status: DC | PRN
  Administered 2017-04-12: 2.5 mg via INTRAVENOUS

## 2017-04-12 MED ORDER — MIDAZOLAM HCL 2 MG/2ML IJ SOLN
INTRAMUSCULAR | Status: DC | PRN
  Administered 2017-04-12: 1 mg via INTRAVENOUS

## 2017-04-12 MED ORDER — ASPIRIN 81 MG PO CHEW: 81.0000 mg | CHEWABLE_TABLET | Freq: Every day | ORAL | Status: DC

## 2017-04-12 MED ORDER — SODIUM CHLORIDE 0.9% FLUSH: 3.0000 mL | Freq: Two times a day (BID) | INTRAVENOUS | Status: DC

## 2017-04-12 MED ORDER — SODIUM CHLORIDE 0.9 % IV SOLN: INTRAVENOUS | Status: AC

## 2017-04-12 MED ORDER — HEPARIN SODIUM (PORCINE) 1000 UNIT/ML IJ SOLN
INTRAMUSCULAR | Status: AC
  Filled 2017-04-12: qty 1

## 2017-04-12 MED ORDER — NITROGLYCERIN 0.4 MG SL SUBL: 0.4000 mg | SUBLINGUAL_TABLET | SUBLINGUAL | 0 refills | Status: AC | PRN

## 2017-04-12 MED ORDER — HEPARIN SODIUM (PORCINE) 1000 UNIT/ML IJ SOLN
INTRAMUSCULAR | Status: DC | PRN
  Administered 2017-04-12: 5000 [IU] via INTRAVENOUS

## 2017-04-12 SURGICAL SUPPLY — 8 items
CATH 5F 110X4 TIG (CATHETERS) ×3 IMPLANT
CATH 5FR PIGTAIL DIAGNOSTIC (CATHETERS) ×3 IMPLANT
CATH INFINITI 5 FR JL3.5 (CATHETERS) ×3 IMPLANT
DEVICE RAD TR BAND REGULAR (VASCULAR PRODUCTS) ×3 IMPLANT
GLIDESHEATH SLEND SS 6F .021 (SHEATH) ×3 IMPLANT
KIT MANI 3VAL PERCEP (MISCELLANEOUS) ×3 IMPLANT
PACK CARDIAC CATH (CUSTOM PROCEDURE TRAY) ×3 IMPLANT
WIRE ROSEN-J .035X260CM (WIRE) ×3 IMPLANT

## 2017-04-12 NOTE — Telephone Encounter (Signed)
tcm ph [facility] for chest pain s/p cath dr. Okey Dupre   Needs 3 weeks fu  Scheduled with End 05/03/17 at 1:20 pm

## 2017-04-12 NOTE — Plan of Care (Signed)
Problem: Safety: Goal: Ability to remain free from injury will improve Outcome: Progressing Fall precautions in place, non skid socks out of bed  Problem: Pain Managment: Goal: General experience of comfort will improve Outcome: Progressing Prn medications  Problem: Tissue Perfusion: Goal: Risk factors for ineffective tissue perfusion will decrease Outcome: Progressing Heparin gtt infusing  Problem: Cardiac: Goal: Ability to achieve and maintain adequate cardiopulmonary perfusion will improve Outcome: Progressing For heart catherization today

## 2017-04-12 NOTE — Progress Notes (Signed)
Pt returned from cath lab.  Rt wrist dressing dry and intact, no bruising.  Rt arm up on pillow, reminded pt of limited use of that rt arm, pt verbalizes understanding.  Pulses equal bilaterally.  Denies chest pain or need at this time.  Wife at bedside.

## 2017-04-12 NOTE — Progress Notes (Signed)
To cath lab via bed.

## 2017-04-12 NOTE — H&P (View-Only) (Signed)
CARDIOLOGY CONSULT NOTE  Patient ID: Albert Burton MRN: [id number] DOB/AGE: [birth year] 51 y.o.  Admit date: 04/11/2017 Requesting Physician:  Dr. Juliene Pina Primary Physician: Mickey Farber, MD Primary Cardiologist : new Reason for Consultation unstable angina  Date:  04/11/2017    Chief Complaint  Patient presents with  . Chest Pain      History of Present Illness: Albert Burton is a 51 y.o. male who  Presented with chest pain at rest radiating to his left arm. The patient reports having cardiac catheterization more than 10 years ago at [facility] which showed 30% stenosis in one of his arteries. I could not find these records. Otherwise he is relatively healthy with only GERD and hyperlipidemia not on treatment. He is a lifelong nonsmoker. He does have family history of coronary artery disease. Over the last few months, he has experienced intermittent episodes of palpitations and tachycardia that usually causes him to wake up in the early morning hours as if he is having a nightmare. He had an episode this morning that was subsequently associated with left-sided chest heaviness and tightness with radiation to his left arm. This persisted for hours and currently he continues to have some left arm numbness and mild chest tightness. He is not aware of prior history of arrhythmia. He describes mild exertional dyspnea. His EKG showed sinus rhythm with left anterior fascicular block and nonspecific ST changes. His troponin has been negative. He is currently on heparin drip. He reports significant improvement in symptoms after he was given sublingual nitroglycerin.    Past Medical History:  Diagnosis Date  . High cholesterol     History reviewed. No pertinent surgical history.   Current Facility-Administered Medications  Medication Dose Route Frequency Provider Last Rate Last Dose  . acetaminophen (TYLENOL) tablet 650 mg  650 mg Oral Q6H PRN Adrian Saran, MD       Or  .  acetaminophen (TYLENOL) suppository 650 mg  650 mg Rectal Q6H PRN Adrian Saran, MD      . Melene Muller ON 04/12/2017] aspirin EC tablet 325 mg  325 mg Oral Daily Sital Mody, MD      . bisacodyl (DULCOLAX) EC tablet 5 mg  5 mg Oral Daily PRN Adrian Saran, MD      . heparin ADULT infusion 100 units/mL (25000 units/271mL sodium chloride 0.45%)  1,000 Units/hr Intravenous Continuous Adrian Saran, MD 10 mL/hr at 04/11/17 1501 1,000 Units/hr at 04/11/17 1501  . HYDROcodone-acetaminophen (NORCO/VICODIN) 5-325 MG per tablet 1-2 tablet  1-2 tablet Oral Q4H PRN Adrian Saran, MD      . metoprolol tartrate (LOPRESSOR) tablet 12.5 mg  12.5 mg Oral BID Sital Mody, MD      . nitroGLYCERIN (NITROSTAT) SL tablet 0.4 mg  0.4 mg Sublingual Q5 min PRN Nita Sickle, MD   0.4 mg at 04/11/17 1204  . ondansetron (ZOFRAN) tablet 4 mg  4 mg Oral Q6H PRN Adrian Saran, MD       Or  . ondansetron (ZOFRAN) injection 4 mg  4 mg Intravenous Q6H PRN Sital Mody, MD      . rosuvastatin (CRESTOR) tablet 10 mg  10 mg Oral Daily Adrian Saran, MD   10 mg at 04/11/17 1654  . senna-docusate (Senokot-S) tablet 1 tablet  1 tablet Oral QHS PRN Sital Mody, MD      . sodium chloride flush (NS) 0.9 % injection 3 mL  3 mL Intravenous Q12H Adrian Saran, MD  Allergies:   Patient has no known allergies.    Social History:  The patient  reports that he has never smoked. He has never used smokeless tobacco. He reports that he does not drink alcohol.   Family History:  The patient's remarkable for coronary artery disease and stroke.   ROS:  Please see the history of present illness.   Otherwise, review of systems are positive for none.   All other systems are reviewed and negative.    PHYSICAL EXAM: VS:  BP 135/73 (BP Location: Left Arm)   Pulse (!) 59   Temp 97.8 F (36.6 C) (Oral)   Resp 16   Ht  (1.854 m)   Wt 211 lb 12.8 oz (96.1 kg)   SpO2 100%   BMI 27.94 kg/m  , BMI Body mass index is 27.94 kg/m. GEN: Well nourished, well  developed, in no acute distress  HEENT: normal  Neck: no JVD, carotid bruits, or masses Cardiac: RRR; no murmurs, rubs, or gallops,no edema  Respiratory:  clear to auscultation bilaterally, normal work of breathing GI: soft, nontender, nondistended, + BS MS: no deformity or atrophy  Skin: warm and dry, no rash Neuro:  Strength and sensation are intact Psych: euthymic mood, full affect   EKG:   Personally reviewed by me and showed: Normal sinus rhythm with left anterior fascicular block and nonspecific ST changes. Telemetry recently reviewed by me and showed: Normal sinus rhythm.  Recent Labs: 04/11/2017: BUN 13; Creatinine, Ser 0.84; Hemoglobin 15.4; Platelets 228; Potassium 4.3; Sodium 136    Lipid Panel No results found for: CHOL, TRIG, HDL, CHOLHDL, VLDL, LDLCALC, LDLDIRECT    Wt Readings from Last 3 Encounters:  04/11/17 211 lb 12.8 oz (96.1 kg)        ASSESSMENT AND PLAN:  1.  Unstable angina: The patient's symptoms are highly worrisome for unstable angina with some nonspecific EKG changes. He also continues to have mild chest discomfort radiating to his left arm. I discussed with him different management options including proceeding with stress testing in the morning as long as enzymes remain negative versus proceeding directly with cardiac catheterization. I favor the second given his continued symptoms and I discussed the procedure in details with him as well as risks and benefits. He understands and wants to proceed with cardiac catheterization. Further recommendations to follow after cardiac cath. In the meanwhile, continue treatment with aspirin, unfractionated heparin and rosuvastatin.  2. Nighttime palpitations: If cardiac catheterization does not show obstructive disease, we should consider 30 day outpatient telemetry. Some of his nighttime symptoms might be triggered by GERD or undiagnosed sleep apnea. He denies symptoms of sleep apnea though.  Signed,  Lorine Bears, MD  04/11/2017 5:38 PM    [facility]

## 2017-04-12 NOTE — Telephone Encounter (Signed)
Patient currently admitted

## 2017-04-12 NOTE — Discharge Summary (Signed)
[facility] - [place] [facility]   PATIENT NAME: Albert Burton    MR#:  [id number]  DATE OF BIRTH:  11-08-1966  DATE OF ADMISSION:  04/11/2017 ADMITTING PHYSICIAN: Adrian Saran, MD  DATE OF DISCHARGE: 04/12/17  PRIMARY CARE PHYSICIAN: THIES, DAVID, MD    ADMISSION DIAGNOSIS:  Chest pain, unspecified type [R07.9]  DISCHARGE DIAGNOSIS:  Chest pain with mild CAD on Left heart cath Hyperlipidemia  SECONDARY DIAGNOSIS:   Past Medical History:  Diagnosis Date  . High cholesterol     HOSPITAL COURSE:  51 year old male with history of hyperlipidemia currently diet controlled who presents with unstable angina.  1. Chest pain with negative troponin and no ekg changes: Patient has typical symptoms which were relieved with nitroglycerin. Pt was on Full dose heparin gtt, ASA, statin. Cardiac cath shows minimal CAD -no further w/u. Ok from cardiac standpoint to go home  2/ HLD: Diet controlled. Now on statins  lipid panel abnormal  Overall stable D/C home  CONSULTS OBTAINED:  Treatment Team:  Iran Ouch, MD  DRUG ALLERGIES:  No Known Allergies  DISCHARGE MEDICATIONS:   Current Discharge Medication List    START taking these medications   Details  aspirin 81 MG chewable tablet Chew 1 tablet (81 mg total) by mouth daily. Qty: 30 tablet, Refills: 1    metoprolol tartrate (LOPRESSOR) 25 MG tablet Take 0.5 tablets (12.5 mg total) by mouth 2 (two) times daily. Qty: 60 tablet, Refills: 0    nitroGLYCERIN (NITROSTAT) 0.4 MG SL tablet Place 1 tablet (0.4 mg total) under the tongue every 5 (five) minutes as needed for chest pain. Qty: 20 tablet, Refills: 0    rosuvastatin (CRESTOR) 10 MG tablet Take 1 tablet (10 mg total) by mouth daily. Qty: 30 tablet, Refills: 1      CONTINUE these medications which have NOT CHANGED   Details  ranitidine (ZANTAC) 150 MG tablet Take 150 mg by mouth daily.        If you experience worsening of your  admission symptoms, develop shortness of breath, life threatening emergency, suicidal or homicidal thoughts you must seek medical attention immediately by calling 911 or calling your MD immediately  if symptoms less severe.  You Must read complete instructions/literature along with all the possible adverse reactions/side effects for all the Medicines you take and that have been prescribed to you. Take any new Medicines after you have completely understood and accept all the possible adverse reactions/side effects.   Please note  You were cared for by a hospitalist during your hospital stay. If you have any questions about your discharge medications or the care you received while you were in the hospital after you are discharged, you can call the unit and asked to speak with the hospitalist on call if the hospitalist that took care of you is not available. Once you are discharged, your primary care physician will handle any further medical issues. Please note that NO REFILLS for any discharge medications will be authorized once you are discharged, as it is imperative that you return to your primary care physician (or establish a relationship with a primary care physician if you do not have one) for your aftercare needs so that they can reassess your need for medications and monitor your lab values. Today   SUBJECTIVE   Doing well  VITAL SIGNS:  Blood pressure 122/72, pulse (!) 58, temperature 97.8 F (36.6 C), temperature source Oral, resp. rate 16, height  (1.854 m),  weight 95.7 kg (211 lb), SpO2 95 %.  I/O:   Intake/Output Summary (Last 24 hours) at 04/12/17 1404 Last data filed at 04/12/17 1359  Gross per 24 hour  Intake           787.72 ml  Output              600 ml  Net           187.72 ml    PHYSICAL EXAMINATION:  GENERAL:  51 y.o.-year-old patient lying in the bed with no acute distress.  EYES: Pupils equal, round, reactive to light and accommodation. No scleral icterus.  Extraocular muscles intact.  HEENT: Head atraumatic, normocephalic. Oropharynx and nasopharynx clear.  NECK:  Supple, no jugular venous distention. No thyroid enlargement, no tenderness.  LUNGS: Normal breath sounds bilaterally, no wheezing, rales,rhonchi or crepitation. No use of accessory muscles of respiration.  CARDIOVASCULAR: S1, S2 normal. No murmurs, rubs, or gallops.  ABDOMEN: Soft, non-tender, non-distended. Bowel sounds present. No organomegaly or mass.  EXTREMITIES: No pedal edema, cyanosis, or clubbing.  NEUROLOGIC: Cranial nerves II through XII are intact. Muscle strength 5/5 in all extremities. Sensation intact. Gait not checked.  PSYCHIATRIC: The patient is alert and oriented x 3.  SKIN: No obvious rash, lesion, or ulcer.   DATA REVIEW:   CBC   Recent Labs Lab 04/12/17 0358  WBC 7.9  HGB 14.8  HCT 43.7  PLT 220    Chemistries   Recent Labs Lab 04/11/17 1038 04/12/17 0358  NA 136  --   K 4.3  --   CL 106  --   CO2 23  --   GLUCOSE 97  --   BUN 13  --   CREATININE 0.84 0.64  CALCIUM 9.3  --     Microbiology Results   No results found for this or any previous visit (from the past 240 hour(s)).  RADIOLOGY:  Dg Chest 2 View  Result Date: 04/11/2017 CLINICAL DATA:  States left sided chest pain that radiates under her left breast and down his left arm, states the pain woke him up in the middle of the night, states intermittent episodes of same for the past month. Never a smoker EXAM: CHEST  2 VIEW COMPARISON:  07/02/2017 FINDINGS: The heart size and mediastinal contours are within normal limits. Both lungs are clear. The visualized skeletal structures are unremarkable. IMPRESSION: No active cardiopulmonary disease. Electronically Signed   By: Elige Ko   On: 04/11/2017 11:13   Ct Head Wo Contrast  Result Date: 04/11/2017 CLINICAL DATA:  51 year old male with history of chest pain and left upper extremity numbness this morning. Difficulty finding words.  EXAM: CT HEAD WITHOUT CONTRAST TECHNIQUE: Contiguous axial images were obtained from the base of the skull through the vertex without intravenous contrast. COMPARISON:  No priors. FINDINGS: Brain: No evidence of acute infarction, hemorrhage, hydrocephalus, extra-axial collection or mass lesion/mass effect. Vascular: No hyperdense vessel or unexpected calcification. Skull: Normal. Negative for fracture or focal lesion. Sinuses/Orbits: No acute finding. Other: None. IMPRESSION: 1. No acute intracranial abnormalities. 2. Normal appearance of the brain. Electronically Signed   By: Trudie Reed M.D.   On: 04/11/2017 12:56     Management plans discussed with the patient, family and they are in agreement.  CODE STATUS:     Code Status Orders        Start     Ordered   04/11/17 1555  Full code  Continuous     04/11/17  1554    Code Status History    Date Active Date Inactive Code Status Order ID Comments User Context   This patient has a current code status but no historical code status.      TOTAL TIME TAKING CARE OF THIS PATIENT: 40 minutes.    Christpoher Sievers M.D on 04/12/2017 at 2:04 PM  Between 7am to 6pm - Pager - [phone] After 6pm go to [email or web address] - password [email or web address]  [facility]  Office  [phone]  CC: Primary care physician; Mickey Farber, MD

## 2017-04-12 NOTE — Progress Notes (Signed)
Pt discharged to home via wc.  Instructions  given to pt.  Questions answered.  No distress.  

## 2017-04-12 NOTE — Interval H&P Note (Signed)
History and Physical Interval Note:  04/12/2017 7:31 AM  Albert Burton  has presented today for cardiac catheterization, with the diagnosis of unstable angina. The various methods of treatment have been discussed with the patient and family. After consideration of risks, benefits and other options for treatment, the patient has consented to  Procedure(s): Left Heart Cath and Coronary Angiography and possible PCI (N/A) as a surgical intervention .  The patient's history has been reviewed, patient examined, no change in status, stable for surgery.  I have reviewed the patient's chart and labs.  Questions were answered to the patient's satisfaction.    Cath Lab Visit (complete for each Cath Lab visit)  Clinical Evaluation Leading to the Procedure:   ACS: Yes.   (unstable angina)  Non-ACS: N/A  Shaunda Tipping

## 2017-04-12 NOTE — Progress Notes (Signed)
Progress Note  Patient Name: Albert Burton Date of Encounter: 04/12/2017  Primary Cardiologist: New - Arida  Subjective   Chest pain has resolved. Patient continues to have mild numbness involving the left hand. No shortness of breath.  Inpatient Medications    Scheduled Meds: . [MAR Hold] aspirin EC  325 mg Oral Daily  . [MAR Hold] metoprolol tartrate  12.5 mg Oral BID  . [MAR Hold] rosuvastatin  10 mg Oral Daily  . [MAR Hold] sodium chloride flush  3 mL Intravenous Q12H   Continuous Infusions: . sodium chloride 10 mL/hr at 04/12/17 0942  . heparin Stopped (04/12/17 0845)   PRN Meds: [ZOX Hold] acetaminophen **OR** [MAR Hold] acetaminophen, [MAR Hold] bisacodyl, [MAR Hold] HYDROcodone-acetaminophen, [MAR Hold] nitroGLYCERIN, [MAR Hold] ondansetron **OR** [MAR Hold] ondansetron (ZOFRAN) IV, [MAR Hold] senna-docusate   Vital Signs    Vitals:   04/11/17 1942 04/12/17 0522 04/12/17 0722 04/12/17 0839  BP: 132/77 121/69 130/86   Pulse: 60 (!) 56 63 (!) 55  Resp: 16  17   Temp: 98.1 F (36.7 C)  97.5 F (36.4 C) 97.8 F (36.6 C)  TempSrc: Oral  Oral Oral  SpO2: 96% 98% 94%   Weight:    211 lb (95.7 kg)  Height:     (1.854 m)    Intake/Output Summary (Last 24 hours) at 04/12/17 1022 Last data filed at 04/12/17 0730  Gross per 24 hour  Intake          1727.72 ml  Output                0 ml  Net          1727.72 ml   Filed Weights   04/11/17 1040 04/11/17 1534 04/12/17 0839  Weight: 200 lb (90.7 kg) 211 lb 12.8 oz (96.1 kg) 211 lb (95.7 kg)    Telemetry    Normal sinus rhythm - Personally Reviewed  ECG    Normal sinus rhythm with right axis deviation and nonspecific intraventricular conduction delay - Personally Reviewed  Physical Exam   GEN: No acute distress.   Neck: No JVD Cardiac: RRR, no murmurs, rubs, or gallops.2+ radial and pedal pulses bilaterally.  Respiratory: Clear to auscultation bilaterally. GI: Soft, nontender, non-distended    MS: No edema; No deformity. Neuro:  Nonfocal  Psych: Normal affect   Labs    Chemistry Recent Labs Lab 04/11/17 1038  NA 136  K 4.3  CL 106  CO2 23  GLUCOSE 97  BUN 13  CREATININE 0.84  CALCIUM 9.3  GFRNONAA >60  GFRAA >60  ANIONGAP 7     Hematology Recent Labs Lab 04/11/17 1038 04/12/17 0358  WBC 7.5 7.9  RBC 5.01 4.84  HGB 15.4 14.8  HCT 45.0 43.7  MCV 90.0 90.2  MCH 30.8 30.6  MCHC 34.3 33.9  RDW 13.2 13.6  PLT 228 220    Cardiac Enzymes Recent Labs Lab 04/11/17 1038 04/11/17 1626 04/11/17 2132 04/12/17 0358  TROPONINI <0.03 <0.03 <0.03 <0.03   No results for input(s): TROPIPOC in the last 168 hours.   BNPNo results for input(s): BNP, PROBNP in the last 168 hours.   DDimer No results for input(s): DDIMER in the last 168 hours.   Radiology    Dg Chest 2 View  Result Date: 04/11/2017 CLINICAL DATA:  States left sided chest pain that radiates under her left breast and down his left arm, states the pain woke him up in the middle of  the night, states intermittent episodes of same for the past month. Never a smoker EXAM: CHEST  2 VIEW COMPARISON:  07/02/2017 FINDINGS: The heart size and mediastinal contours are within normal limits. Both lungs are clear. The visualized skeletal structures are unremarkable. IMPRESSION: No active cardiopulmonary disease. Electronically Signed   By: Elige Ko   On: 04/11/2017 11:13   Ct Head Wo Contrast  Result Date: 04/11/2017 CLINICAL DATA:  51 year old male with history of chest pain and left upper extremity numbness this morning. Difficulty finding words. EXAM: CT HEAD WITHOUT CONTRAST TECHNIQUE: Contiguous axial images were obtained from the base of the skull through the vertex without intravenous contrast. COMPARISON:  No priors. FINDINGS: Brain: No evidence of acute infarction, hemorrhage, hydrocephalus, extra-axial collection or mass lesion/mass effect. Vascular: No hyperdense vessel or unexpected calcification.  Skull: Normal. Negative for fracture or focal lesion. Sinuses/Orbits: No acute finding. Other: None. IMPRESSION: 1. No acute intracranial abnormalities. 2. Normal appearance of the brain. Electronically Signed   By: Trudie Reed M.D.   On: 04/11/2017 12:56    Cardiac Studies   LHC (04/12/17): 1. Mild, non-obstructive coronary artery disease with 30% lesions in the mid LCx and mid/distal RCA. 2. Normal left ventricular contraction. 3. Normal left ventricular filling pressure.  Patient Profile     51 y.o. male man with history of hyperlipidemia, admitted with left chest pain and left arm paresthesias, concerning for unstable angina.  Assessment & Plan    Unstable angina Chest pain has resolved, the left arm paresthesia persists. Troponins have been negative. Cardiac catheterization today revealed mild, nonobstructive CAD with normal LV filling pressure and contraction. Considerations for his pain include vasospasm/microvascular disease, given improvement with sublingual nitroglycerin, and noncardiac etiology.  Continue medical therapy to prevent progression of mild CAD. Would continue with indefinite low-dose aspirin and statin therapy given elevated LDL.  Continue low-dose beta blocker; could consider adding or switching to long-acting nitrate if chest pain recurs.  Recommend evaluation for noncardiac causes of the patient's symptoms. In particular, neurologic evaluation given left hand numbness should be entertained by the primary service.  Hyperlipidemia Lipid panel notable for LDL of 170 and triglycerides of 191.  Given CAD, albeit mild, recommend continued statin therapy.  Nocturnal palpitations Unclear what this represents. Given lack of obstructive CAD, outpatient monitoring should be considered. GERD and sleep apnea are also possibilities.  Obtain 30 day event monitor at the time of discharge.  Restart famotidine for empiric treatment of GERD.  Consider sleep study if  symptoms persist; this can be readdressed as an outpatient.  Signed, Yvonne Kendall, MD  04/12/2017, 10:22 AM

## 2017-04-14 NOTE — Telephone Encounter (Signed)
Patient contacted regarding discharge from [facility] on 04/12/17.   Patient understands to follow up with provider ? On 05/03/17 at 2:00pm  at [facility] with Dr End.  Patient understands discharge instructions? Yes Patient understands medications and regiment? Yes  Patient understands to bring all medications to this visit? Yes

## 2017-05-02 ENCOUNTER — Telehealth: Payer: Self-pay | Admitting: Internal Medicine

## 2017-05-02 ENCOUNTER — Encounter: Payer: Self-pay | Admitting: Emergency Medicine

## 2017-05-02 ENCOUNTER — Emergency Department
Admission: EM | Admit: 2017-05-02 | Discharge: 2017-05-02 | Disposition: A | Discharge status: Home / Self Care | Payer: PRIVATE HEALTH INSURANCE | Attending: Emergency Medicine | Admitting: Emergency Medicine

## 2017-05-02 ENCOUNTER — Emergency Department: Payer: PRIVATE HEALTH INSURANCE

## 2017-05-02 DIAGNOSIS — R202 Paresthesia of skin: Secondary | ICD-10-CM

## 2017-05-02 DIAGNOSIS — Z79899 Other long term (current) drug therapy: Secondary | ICD-10-CM | POA: Diagnosis not present

## 2017-05-02 DIAGNOSIS — R531 Weakness: Secondary | ICD-10-CM | POA: Insufficient documentation

## 2017-05-02 DIAGNOSIS — R51 Headache: Secondary | ICD-10-CM | POA: Diagnosis not present

## 2017-05-02 DIAGNOSIS — R29898 Other symptoms and signs involving the musculoskeletal system: Secondary | ICD-10-CM

## 2017-05-02 DIAGNOSIS — R42 Dizziness and giddiness: Secondary | ICD-10-CM

## 2017-05-02 DIAGNOSIS — Z7982 Long term (current) use of aspirin: Secondary | ICD-10-CM | POA: Insufficient documentation

## 2017-05-02 DIAGNOSIS — R200 Anesthesia of skin: Secondary | ICD-10-CM | POA: Insufficient documentation

## 2017-05-02 LAB — CBC
HCT: 43.7 % (ref 40.0–52.0)
HEMOGLOBIN: 15.1 g/dL (ref 13.0–18.0)
MCH: 31.4 pg (ref 26.0–34.0)
MCHC: 34.5 g/dL (ref 32.0–36.0)
MCV: 90.8 fL (ref 80.0–100.0)
PLATELETS: 202 10*3/uL (ref 150–440)
RBC: 4.81 MIL/uL (ref 4.40–5.90)
RDW: 12.9 % (ref 11.5–14.5)
WBC: 8 10*3/uL (ref 3.8–10.6)

## 2017-05-02 LAB — BASIC METABOLIC PANEL
ANION GAP: 7 (ref 5–15)
BUN: 16 mg/dL (ref 6–20)
CALCIUM: 9.3 mg/dL (ref 8.9–10.3)
CO2: 22 mmol/L (ref 22–32)
CREATININE: 0.74 mg/dL (ref 0.61–1.24)
Chloride: 109 mmol/L (ref 101–111)
Glucose, Bld: 98 mg/dL (ref 65–99)
Potassium: 4.2 mmol/L (ref 3.5–5.1)
Sodium: 138 mmol/L (ref 135–145)

## 2017-05-02 LAB — TROPONIN I

## 2017-05-02 MED ORDER — ASPIRIN EC 325 MG PO TBEC: 325.0000 mg | DELAYED_RELEASE_TABLET | Freq: Every day | ORAL | 0 refills | Status: DC

## 2017-05-02 MED ORDER — MECLIZINE HCL 25 MG PO TABS: 25.0000 mg | ORAL_TABLET | Freq: Three times a day (TID) | ORAL | 1 refills | Status: DC | PRN

## 2017-05-02 NOTE — Telephone Encounter (Signed)
Called patient and let him know Dr Serita KyleEnd's advice.  Patient said he was about to walk into the Emergency room right at that moment.

## 2017-05-02 NOTE — ED Notes (Signed)
Patient transported to MRI 

## 2017-05-02 NOTE — Telephone Encounter (Signed)
Received incoming call. Patient states he woke up Saturday with new symptoms of staggering, trying to his balance, dizziness, blurred vision. These are new symptoms that he did not have prior to or at discharge from his recent hospitalization. He has had a constant headache since then. He has a history of left hand numbness but now his whole arm feels numb, more that usual.  He also feels like he can't seem to get his words and concentrate, though, he has experienced this in the past. Denies chest pain, chest pressure, nausea, sweating or swelling. He feels very fatigued. Patient does not have a BP cuff and does not know what his BP is at this moment. Heart cath was 04/12/17.  Patient advised to call 911 to go to the ER evaluation. Patient instructed not to drive himself. He verbalized understanding. Routing to Dr End.

## 2017-05-02 NOTE — Telephone Encounter (Signed)
I agree with ED evaluation. Symptoms are not consistent with primary cardiac etiology. Recent LHC showed only mild CAD.  Yvonne Kendallhristopher Kenedy Haisley, MD [facility] Pager: [phone]

## 2017-05-02 NOTE — Telephone Encounter (Signed)
Pt states he has numbness in his left arm, no chest pain, Starting Sat he has had dizziness, everytime he gets up, consistently headaches, and blurry vision. Pt denies CP, no SOB. He asks if he should go to ED or just wait until his appt with Dr. Okey DupreEnd tomorrow. Please advise.

## 2017-05-02 NOTE — Discharge Instructions (Signed)
Your lab tests and MRI/MRA of the brain today were unremarkable. Follow up with cardiology as scheduled tomorrow, and please make an appointment with neurology as well.Return to the ER immediately if you have worsening symptoms including weakness or numbness in other parts of the body or passing out.

## 2017-05-02 NOTE — ED Triage Notes (Signed)
Pt presents to ED c/o numbness in left hand and arm with dizzy spells over the weekend. Pt was scheduled to do f/u with cardiology tomorrow; advised to come here to get everything checked out today.

## 2017-05-02 NOTE — ED Provider Notes (Signed)
[facility] Emergency Department Provider Note  ____________________________________________  Time seen: Approximately 7:54 PM  I have reviewed the triage vital signs and the nursing notes.   HISTORY  Chief Complaint Dizziness and Headache    HPI Albert Burton is a 51 y.o. male who complains ofnumbness and tingling in the left hand and forearm with dizziness that is worse with position changes. This is been present for the past 3 weeks and was initially seen in the Hospital where he had a CT scan of the head and ultimately had a cardiac catheterization done. The workup was negative, and he has follow-up with cardiology tomorrow in clinic. However, the symptoms have been constant over these past several weeks, and seemed to be worsening over the last few days. He is eating and drinking normally. No trauma. Reports some intermittent blurry vision. No vomiting. Also has some generalized headache which is mild. No focal occipital headache. No aggravating or alleviating factors for the numbness..  No chest pain the patient does report intermittent palpitations and feeling like his heart is racing.     Past Medical History:  Diagnosis Date  . High cholesterol      Patient Active Problem List   Diagnosis Date Noted  . Unstable angina (HCC)   . Palpitations   . Mixed hyperlipidemia   . Chest pain 04/11/2017     Past Surgical History:  Procedure Laterality Date  . LEFT HEART CATH AND CORONARY ANGIOGRAPHY N/A 04/12/2017   Procedure: Left Heart Cath and Coronary Angiography and possible PCI;  Surgeon: Yvonne Kendall, MD;  Location: [facility] INVASIVE CV LAB;  Service: Cardiovascular;  Laterality: N/A;     Prior to Admission medications   Medication Sig Start Date End Date Taking? Authorizing Provider  aspirin EC 325 MG tablet Take 1 tablet (325 mg total) by mouth daily. 05/02/17   Sharman Cheek, MD  meclizine (ANTIVERT) 25 MG tablet Take 1 tablet (25 mg  total) by mouth 3 (three) times daily as needed for dizziness or nausea. 05/02/17   Sharman Cheek, MD  metoprolol tartrate (LOPRESSOR) 25 MG tablet Take 0.5 tablets (12.5 mg total) by mouth 2 (two) times daily. 04/12/17   Enedina Finner, MD  nitroGLYCERIN (NITROSTAT) 0.4 MG SL tablet Place 1 tablet (0.4 mg total) under the tongue every 5 (five) minutes as needed for chest pain. 04/12/17   Enedina Finner, MD  ranitidine (ZANTAC) 150 MG tablet Take 150 mg by mouth daily.    [provider]  rosuvastatin (CRESTOR) 10 MG tablet Take 1 tablet (10 mg total) by mouth daily. 04/13/17   Enedina Finner, MD     Allergies Patient has no known allergies.   History reviewed. No pertinent family history.  Social History Social History  Substance Use Topics  . Smoking status: Never Smoker  . Smokeless tobacco: Never Used  . Alcohol use No    Review of Systems  Constitutional:   No fever or chills.  ENT:   No sore throat. No rhinorrhea. Lymphatic: No swollen glands, No extremity swelling Endocrine: No hot/cold flashes. No significant weight change. No neck swelling. Cardiovascular:   No chest pain or syncope. Respiratory:   No dyspnea or cough. Gastrointestinal:   Negative for abdominal pain, vomiting and diarrhea.  Genitourinary:   Negative for dysuria or difficulty urinating. Musculoskeletal:   Negative for focal pain or swelling Neurological:   Positive as above for headaches and numbness of the left arm.. All other systems reviewed and are negative  except as documented above in ROS and HPI.  ____________________________________________   PHYSICAL EXAM:  VITAL SIGNS: ED Triage Vitals [05/02/17 1158]  Enc Vitals Group     BP 129/86     Pulse Rate 69     Resp (!) 21     Temp 98.1 F (36.7 C)     Temp Source Oral     SpO2 98 %     Weight 200 lb (90.7 kg)     Height 6\' 1"  (1.854 m)     Head Circumference      Peak Flow      Pain Score 0     Pain Loc      Pain Edu?      Excl. in  GC?     Vital signs reviewed, nursing assessments reviewed.   Constitutional:   Alert and oriented. Well appearing and in no distress. Eyes:   No scleral icterus. No conjunctival pallor. PERRL. EOMI.  No nystagmus. ENT   Head:   Normocephalic and atraumatic.   Nose:   No congestion/rhinnorhea. No septal hematoma   Mouth/Throat:   MMM, no pharyngeal erythema. No peritonsillar mass.    Neck:   No stridor. No SubQ emphysema. No meningismus. Hematological/Lymphatic/Immunilogical:   No cervical lymphadenopathy. Cardiovascular:   RRR. Symmetric bilateral radial and DP pulses.  No murmurs.  Respiratory:   Normal respiratory effort without tachypnea nor retractions. Breath sounds are clear and equal bilaterally. No wheezes/rales/rhonchi. Gastrointestinal:   Soft and nontender. Non distended. There is no CVA tenderness.  No rebound, rigidity, or guarding. Genitourinary:   deferred Musculoskeletal:   Normal range of motion in all extremities. No joint effusions.  No lower extremity tenderness.  No edema. Neurologic:   Normal speech and language.  CN 2-10 normal. Diffuse weakness, 4 out of 5 strength in the left upper extremity. Nondermatomal subjectively diminished sensation in the left forearm and hand Positive drift on the left. Positive past pointing with the left hand on finger-to-nose Normal gait Impaired peripheral vision of the left eye NIH stroke scale 5, unclear acute versus chronic.Marland Kitchen No gross focal neurologic deficits are appreciated.  Skin:    Skin is warm, dry and intact. No rash noted.  No petechiae, purpura, or bullae.  ____________________________________________    LABS (pertinent positives/negatives) (all labs ordered are listed, but only abnormal results are displayed) Labs Reviewed  BASIC METABOLIC PANEL  CBC  TROPONIN I  URINALYSIS, COMPLETE (UACMP) WITH MICROSCOPIC   ____________________________________________   EKG  Interpreted by me Normal  sinus rhythm rate of 65, left axis, normal intervals. Poor R-wave progression in anterior precordial leads. Normal ST segments and T waves.  ____________________________________________    RADIOLOGY  Mr Angiogram Head Wo Contrast  Result Date: 05/02/2017 CLINICAL DATA:  Numbness in LEFT hand. Dizzy spells. Symptoms developed a few days ago. Possible stroke. EXAM: MRI HEAD WITHOUT CONTRAST MRA HEAD WITHOUT CONTRAST TECHNIQUE: Multiplanar, multiecho pulse sequences of the brain and surrounding structures were obtained without intravenous contrast. Angiographic images of the head were obtained using MRA technique without contrast. COMPARISON:  CT head 04/11/2017. FINDINGS: MRI HEAD FINDINGS Brain: No evidence for acute infarction, hemorrhage, mass lesion, hydrocephalus, or extra-axial fluid. Slight premature for age atrophy. No significant white matter disease. Vascular: Flow voids are maintained throughout the carotid, basilar, and vertebral arteries. There are no areas of chronic hemorrhage. Skull and upper cervical spine: Unremarkable visualized calvarium, skullbase, and cervical vertebrae. Pituitary, pineal, cerebellar tonsils unremarkable. No upper cervical cord lesions. Sinuses/Orbits:  No significant layering fluid. No acute orbital findings. Other: None. MRA HEAD FINDINGS The internal carotid arteries are widely patent. Basilar artery is widely patent with vertebrals codominant. No MCA or PCA stenosis or occlusion. Dominant LEFT A1 ACA contributes to both anterior cerebral artery supplied distally. Hypoplastic RIGHT A1 ACA. No cerebellar branch occlusion.  No intracranial saccular aneurysm. IMPRESSION: Slight premature for age atrophy.  No acute intracranial findings. No intracranial stenosis or dissection. Electronically Signed   By: Elsie StainJohn T Curnes M.D.   On: 05/02/2017 19:04   Mr Brain Wo Contrast  Result Date: 05/02/2017 CLINICAL DATA:  Numbness in LEFT hand. Dizzy spells. Symptoms developed a few  days ago. Possible stroke. EXAM: MRI HEAD WITHOUT CONTRAST MRA HEAD WITHOUT CONTRAST TECHNIQUE: Multiplanar, multiecho pulse sequences of the brain and surrounding structures were obtained without intravenous contrast. Angiographic images of the head were obtained using MRA technique without contrast. COMPARISON:  CT head 04/11/2017. FINDINGS: MRI HEAD FINDINGS Brain: No evidence for acute infarction, hemorrhage, mass lesion, hydrocephalus, or extra-axial fluid. Slight premature for age atrophy. No significant white matter disease. Vascular: Flow voids are maintained throughout the carotid, basilar, and vertebral arteries. There are no areas of chronic hemorrhage. Skull and upper cervical spine: Unremarkable visualized calvarium, skullbase, and cervical vertebrae. Pituitary, pineal, cerebellar tonsils unremarkable. No upper cervical cord lesions. Sinuses/Orbits: No significant layering fluid. No acute orbital findings. Other: None. MRA HEAD FINDINGS The internal carotid arteries are widely patent. Basilar artery is widely patent with vertebrals codominant. No MCA or PCA stenosis or occlusion. Dominant LEFT A1 ACA contributes to both anterior cerebral artery supplied distally. Hypoplastic RIGHT A1 ACA. No cerebellar branch occlusion.  No intracranial saccular aneurysm. IMPRESSION: Slight premature for age atrophy.  No acute intracranial findings. No intracranial stenosis or dissection. Electronically Signed   By: Elsie StainJohn T Curnes M.D.   On: 05/02/2017 19:04    ____________________________________________   PROCEDURES Procedures  ____________________________________________   INITIAL IMPRESSION / ASSESSMENT AND PLAN / ED COURSE  Pertinent labs & imaging results that were available during my care of the patient were reviewed by me and considered in my medical decision making (see chart for details).   Clinical Course as of May 02 1953  [facility]  16[facility] Sx c/w subacute stroke. Will get mri/mra   [PS]    Clinical Course User Index [PS] Sharman CheekStafford, Codie Hainer, MD    ----------------------------------------- 8:00 PM on 05/02/2017 -----------------------------------------  Workup negative. No evidence of stroke, aneurysm, multiple sclerosis, or other intracranial lesion. Low suspicion for meningitis or encephalitis. Given the negative workup, I suppose is possible patient has a peripheral neuropathy or cervical radiculopathy at the same time is also having vertigo from inner ear dysfunction. He has follow-up with cardiology tomorrow which I encouraged him to keep. Also instructed the patient to follow up with neurology for further assessment. Increase aspirin to full dose 325 daily. Return precautions given.   ____________________________________________   FINAL CLINICAL IMPRESSION(S) / ED DIAGNOSES  Final diagnoses:  Dizziness  Arm paresthesia, left  Left arm weakness      New Prescriptions   ASPIRIN EC 325 MG TABLET    Take 1 tablet (325 mg total) by mouth daily.   MECLIZINE (ANTIVERT) 25 MG TABLET    Take 1 tablet (25 mg total) by mouth 3 (three) times daily as needed for dizziness or nausea.     Portions of this note were generated with dragon dictation software. Dictation errors may occur despite best attempts at proofreading.  Sharman Cheek, MD 05/02/17 2001

## 2017-05-03 ENCOUNTER — Encounter: Payer: Self-pay | Admitting: Internal Medicine

## 2017-05-03 ENCOUNTER — Ambulatory Visit (INDEPENDENT_AMBULATORY_CARE_PROVIDER_SITE_OTHER): Payer: PRIVATE HEALTH INSURANCE | Admitting: Internal Medicine

## 2017-05-03 VITALS — BP 120/86 | HR 61 | Ht 73.0 in | Wt 204.2 lb

## 2017-05-03 DIAGNOSIS — I2510 Atherosclerotic heart disease of native coronary artery without angina pectoris: Secondary | ICD-10-CM | POA: Diagnosis not present

## 2017-05-03 DIAGNOSIS — R202 Paresthesia of skin: Secondary | ICD-10-CM | POA: Diagnosis not present

## 2017-05-03 DIAGNOSIS — R002 Palpitations: Secondary | ICD-10-CM | POA: Diagnosis not present

## 2017-05-03 DIAGNOSIS — R5383 Other fatigue: Secondary | ICD-10-CM | POA: Diagnosis not present

## 2017-05-03 DIAGNOSIS — E782 Mixed hyperlipidemia: Secondary | ICD-10-CM

## 2017-05-03 DIAGNOSIS — R42 Dizziness and giddiness: Secondary | ICD-10-CM

## 2017-05-03 NOTE — Patient Instructions (Addendum)
Medication Instructions:  Your physician recommends that you continue on your current medications as directed. Please refer to the Current Medication list given to you today.   Labwork: none  Testing/Procedures: Your physician has requested that you have an echocardiogram. Echocardiography is a painless test that uses sound waves to create images of your heart. It provides your doctor with information about the size and shape of your heart and how well your heart's chambers and valves are working. This procedure takes approximately one hour. There are no restrictions for this procedure.  IN THE NEXT 1-2 WEEKS PLEASE.  Your physician has recommended that you wear an 14 DAY ZIO PATCH event monitor. Event monitors are medical devices that record the heart's electrical activity. Doctors most often us these monitors to diagnose arrhythmias. Arrhythmias are problems with the speed or rhythm of the heartbeat. The monitor is a small, portable device. You can wear one while you do your normal daily activities. This is usually used to diagnose what is causing palpitations/syncope (passing out).  TO BE PLACED AFTER THE ECHO HAS BEEN PERFORMED.    Follow-Up: Your physician recommends that you schedule a follow-up appointment in: 6 WEEKS WITH DR END.   If you need a refill on your cardiac medications before your next appointment, please call your pharmacy.  Echocardiogram An echocardiogram, or echocardiography, uses sound waves (ultrasound) to produce an image of your heart. The echocardiogram is simple, painless, obtained within a short period of time, and offers valuable information to your health care provider. The images from an echocardiogram can provide information such as:  Evidence of coronary artery disease (CAD).  Heart size.  Heart muscle function.  Heart valve function.  Aneurysm detection.  Evidence of a past heart attack.  Fluid buildup around the heart.  Heart muscle  thickening.  Assess heart valve function. Tell a health care provider about:  Any allergies you have.  All medicines you are taking, including vitamins, herbs, eye drops, creams, and over-the-counter medicines.  Any problems you or family members have had with anesthetic medicines.  Any blood disorders you have.  Any surgeries you have had.  Any medical conditions you have.  Whether you are pregnant or may be pregnant. What happens before the procedure? No special preparation is needed. Eat and drink normally. What happens during the procedure?  In order to produce an image of your heart, gel will be applied to your chest and a wand-like tool (transducer) will be moved over your chest. The gel will help transmit the sound waves from the transducer. The sound waves will harmlessly bounce off your heart to allow the heart images to be captured in real-time motion. These images will then be recorded.  You may need an IV to receive a medicine that improves the quality of the pictures. What happens after the procedure? You may return to your normal schedule including diet, activities, and medicines, unless your health care provider tells you otherwise. This information is not intended to replace advice given to you by your health care provider. Make sure you discuss any questions you have with your health care provider. Document Released: 12/10/2000 Document Revised: 07/31/2016 Document Reviewed: 08/20/2013 Elsevier Interactive Patient Education  2017 [employer]    Cardiac Event Monitoring A cardiac event monitor is a small recording device that is used to detect abnormal heart rhythms (arrhythmias). The monitor is used to record your heart rhythm when you have symptoms, such as:  Fast heartbeats (palpitations), such as heart racing  or fluttering.  Dizziness.  Fainting or light-headedness.  Unexplained weakness. Some monitors are wired to electrodes placed on your chest.  Electrodes are flat, sticky disks that attach to your skin. Other monitors may be hand-held or worn on the wrist. The monitor can be worn for up to 30 days. If the monitor is attached to your chest, a technician will prepare your chest for the electrode placement and show you how to work the monitor. Take time to practice using the monitor before you leave the office. Make sure you understand how to send the information from the monitor to your health care provider. In some cases, you may need to use a landline telephone instead of a cell phone. What are the risks? Generally, this device is safe to use, but it possible that the skin under the electrodes will become irritated. How to use your cardiac event monitor  Wear your monitor at all times, except when you are in water:  Do not let the monitor get wet.  Take the monitor off when you bathe. Do not swim or use a hot tub with it on.  Keep your skin clean. Do not put body lotion or moisturizer on your chest.  Change the electrodes as told by your health care provider or any time they stop sticking to your skin. You may need to use medical tape to keep them on.  Try to put the electrodes in slightly different places on your chest to help prevent skin irritation. They must remain in the area under your left breast and in the upper right section of your chest.  Make sure the monitor is safely clipped to your clothing or in a location close to your body that your health care provider recommends.  Press the button to record as soon as you feel heart-related symptoms, such as:  Dizziness.  Weakness.  Light-headedness.  Palpitations.  Thumping or pounding in your chest.  Shortness of breath.  Unexplained weakness.  Keep a diary of your activities, such as walking, doing chores, and taking medicine. It is very important to note what you were doing when you pushed the button to record your symptoms. This will help your health care  provider determine what might be contributing to your symptoms.  Send the recorded information as recommended by your health care provider. It may take some time for your health care provider to process the results.  Change the batteries as told by your health care provider.  Keep electronic devices away from your monitor. This includes:  Tablets.  MP3 players.  Cell phones.  While wearing your monitor you should avoid:  Electric blankets.  Firefighter.  Electric toothbrushes.  Microwave ovens.  Magnets.  Metal detectors. Get help right away if:  You have chest pain.  You have extreme difficulty breathing or shortness of breath.  You develop a very fast heartbeat that persists.  You develop dizziness that does not go away.  You faint or constantly feel like you are about to faint. Summary  A cardiac event monitor is a small recording device that is used to help detect abnormal heart rhythms (arrhythmias).  The monitor is used to record your heart rhythm when you have heart-related symptoms.  Make sure you understand how to send the information from the monitor to your health care provider.  It is important to press the button on the monitor when you have any heart-related symptoms.  Keep a diary of your activities, such as walking, doing  chores, and taking medicine. It is very important to note what you were doing when you pushed the button to record your symptoms. This will help your health care provider learn what might be causing your symptoms. This information is not intended to replace advice given to you by your health care provider. Make sure you discuss any questions you have with your health care provider. Document Released: 09/21/2008 Document Revised: 11/27/2016 Document Reviewed: 11/27/2016 Elsevier Interactive Patient Education  2017 [employer].

## 2017-05-03 NOTE — Progress Notes (Signed)
Follow-up Outpatient Visit Date: 05/03/2017  Primary Care Provider: Ezequiel Kayser, MD [place] [address]  Chief Complaint: Dizziness and left arm numbness  HPI:  Albert Burton is a 51 y.o. year-old male with history of hyperlipidemia, who presents for follow-up of dizziness and left arm numbness. I first met him during a hospitalization last month, which time he awoke with chest pain and left arm paresthesias. He also noted intermittent palpitations during this episode. He was admitted for unstable angina and underwent left heart catheterization that showed mild, nonobstructive CAD. His chest pain spontaneously resolved, though he has continued to have frequent episodes of numbness in the left hand. Albert Burton also endorses frequent dizziness, that he describes as feeling off balance. He has not fallen. The dizziness is sometimes orthostatic and positional, though it can also occur when he has been still for some time. He notes significant fatigue over the last few weeks. He also experiences racing of the heart, most noticeable right after he has eaten. He presented to the emergency department yesterday for evaluation of his dizziness and left arm paresthesias. Workup, including MRI/MRA of the brain/head was unrevealing.  During recent hospitalization, Albert Burton was started on low-dose metoprolol. He developed a pruritic rash that resolved after discontinuation of the medication.  --------------------------------------------------------------------------------------------------  Cardiovascular History & Procedures: Cardiovascular Problems:  Nonobstructive coronary artery disease  Palpitations  Dizziness  Risk Factors:  Hyperlipidemia and male gender  Cath/PCI:  LHC (04/12/17): Mild, nonobstructive CAD with 30% mid LCx and mid/distal RCA lesions. Normal left ventricular contraction. Normal left ventricular filling pressure.  CV  Surgery:  None  EP Procedures and Devices:  None  Non-Invasive Evaluation(s):  None  Recent CV Pertinent Labs: Lab Results  Component Value Date   CHOL 249 (H) 04/12/2017   HDL 41 04/12/2017   LDLCALC 170 (H) 04/12/2017   TRIG 191 (H) 04/12/2017   CHOLHDL 6.1 04/12/2017   INR 0.89 04/11/2017   K 4.2 05/02/2017   K 3.3 (L) 03/07/2013   MG 1.9 07/03/2007   BUN 16 05/02/2017   BUN 19 (H) 03/07/2013   CREATININE 0.74 05/02/2017   CREATININE 1.52 (H) 03/07/2013    Past medical and surgical history were reviewed and updated in EPIC.  Outpatient Encounter Prescriptions as of 05/03/2017  Medication Sig  . aspirin EC 325 MG tablet Take 1 tablet (325 mg total) by mouth daily.  . meclizine (ANTIVERT) 25 MG tablet Take 1 tablet (25 mg total) by mouth 3 (three) times daily as needed for dizziness or nausea.  . nitroGLYCERIN (NITROSTAT) 0.4 MG SL tablet Place 1 tablet (0.4 mg total) under the tongue every 5 (five) minutes as needed for chest pain.  . ranitidine (ZANTAC) 150 MG tablet Take 150 mg by mouth daily.  . rosuvastatin (CRESTOR) 10 MG tablet Take 1 tablet (10 mg total) by mouth daily.  . [DISCONTINUED] metoprolol tartrate (LOPRESSOR) 25 MG tablet Take 0.5 tablets (12.5 mg total) by mouth 2 (two) times daily. (Patient not taking: Reported on 05/03/2017)   No facility-administered encounter medications on file as of 05/03/2017.     Allergies: Metoprolol  Social History   Social History  . Marital status: Married    Spouse name: N/A  . Number of children: N/A  . Years of education: N/A   Occupational History  . Not on file.   Social History Main Topics  . Smoking status: Never Smoker  . Smokeless tobacco: Never Used  .  Alcohol use No  . Drug use: No  . Sexual activity: Not on file   Other Topics Concern  . Not on file   Social History Narrative  . No narrative on file    Family History  Problem Relation Age of Onset  . Hyperlipidemia Maternal Grandmother   .  Heart attack Maternal Grandfather   . Stroke Paternal Grandfather     Review of Systems: A 12-system review of systems was performed and was negative except as noted in the HPI.  --------------------------------------------------------------------------------------------------  Physical Exam: BP 120/86 (BP Location: Right Arm, Patient Position: Sitting, Cuff Size: Normal)   Pulse 61   Ht '6\' 1"'$  (1.854 m)   Wt 204 lb 4 oz (92.6 kg)   BMI 26.95 kg/m   Position Blood pressure (mmHg) Heart rate (bpm)  Lying 116/70  64   Sitting 130/80  67   Standing 126/78  70   Standing (3 minutes) 124/86  74    General:  Well-developed, well-nourished man, lying comfortably on the exam table. HEENT: No conjunctival pallor or scleral icterus.  Moist mucous membranes.  OP clear. Neck: Supple without lymphadenopathy, thyromegaly, JVD, or HJR.  No carotid bruit. Lungs: Normal work of breathing.  Clear to auscultation bilaterally without wheezes or crackles. Heart: Regular rate and rhythm without murmurs, rubs, or gallops.  Non-displaced PMI. Abd: Bowel sounds present.  Soft, NT/ND without hepatosplenomegaly Ext: No lower extremity edema.  Radial, PT, and DP pulses are 2+ bilaterally. Right radial arteriotomy site is well-healed. Skin: warm and dry without rash  EKG:  Normal sinus rhythm with single PVC. Borderline nonspecific intraventricular conduction delay (QRS 120 ms). PVC is new since 04/11/17. Otherwise, there has been no significant interval change..  Lab Results  Component Value Date   WBC 8.0 05/02/2017   HGB 15.1 05/02/2017   HCT 43.7 05/02/2017   MCV 90.8 05/02/2017   PLT 202 05/02/2017    Lab Results  Component Value Date   NA 138 05/02/2017   K 4.2 05/02/2017   CL 109 05/02/2017   CO2 22 05/02/2017   BUN 16 05/02/2017   CREATININE 0.74 05/02/2017   GLUCOSE 98 05/02/2017   ALT 87 (H) 03/07/2013    Lab Results  Component Value Date   CHOL 249 (H) 04/12/2017   HDL 41  04/12/2017   LDLCALC 170 (H) 04/12/2017   TRIG 191 (H) 04/12/2017   CHOLHDL 6.1 04/12/2017   --------------------------------------------------------------------------------------------------  ASSESSMENT AND PLAN: Nonobstructive coronary artery disease Albert Burton's not had any recurrence of chest pain. Heart catheterization last month showed mild CAD involving the LCx and RCA. He was intolerant of metoprolol due to a rash with itching. He remains on aspirin and rosuvastatin, which he is tolerating well. We will continue with primary prevention. No further workup at this time.  Dizziness, fatigue, palpitations, and left arm paresthesia Symptoms are nonspecific and have both an orthostatic component and potentially an element of vertigo. MRI the ED yesterday showed slightly premature age atrophy. No acute intracranial findings were identified. Orthostatic vital signs today are normal. Given intermittent palpitations, though not clearly related to his dizziness, we have agreed to obtain a transthoracic echocardiogram and 14-day event monitor. I have also advised Albert Burton to see a neurologist for further evaluation. I will defer the decision to obtain carotid Dopplers to the neurology consultation.  Hyperlipidemia Recent lipid panel was notable for an LDL of 170 and triglycerides of 191. Given CAD, albeit mild, we will continue with rosuvastatin.  We will plan for repeat lipid panel in about 3 months to assess response.  Follow-up: Return to clinic in 6 weeks.  Nelva Bush, MD 05/04/2017 1:14 PM

## 2017-05-04 ENCOUNTER — Encounter: Payer: Self-pay | Admitting: Internal Medicine

## 2017-05-04 DIAGNOSIS — I2510 Atherosclerotic heart disease of native coronary artery without angina pectoris: Secondary | ICD-10-CM | POA: Insufficient documentation

## 2017-05-12 ENCOUNTER — Other Ambulatory Visit: Payer: PRIVATE HEALTH INSURANCE

## 2017-05-13 ENCOUNTER — Other Ambulatory Visit: Payer: Self-pay | Admitting: Neurology

## 2017-05-13 DIAGNOSIS — I639 Cerebral infarction, unspecified: Secondary | ICD-10-CM

## 2017-05-25 ENCOUNTER — Ambulatory Visit
Admission: RE | Admit: 2017-05-25 | Discharge: 2017-05-25 | Disposition: A | Discharge status: Home / Self Care | Payer: PRIVATE HEALTH INSURANCE | Source: Ambulatory Visit | Attending: Neurology | Admitting: Neurology

## 2017-05-25 DIAGNOSIS — G319 Degenerative disease of nervous system, unspecified: Secondary | ICD-10-CM | POA: Diagnosis not present

## 2017-05-25 DIAGNOSIS — I639 Cerebral infarction, unspecified: Secondary | ICD-10-CM

## 2017-05-25 DIAGNOSIS — H9393 Unspecified disorder of ear, bilateral: Secondary | ICD-10-CM | POA: Insufficient documentation

## 2017-05-25 MED ORDER — GADOBENATE DIMEGLUMINE 529 MG/ML IV SOLN
20.0000 mL | Freq: Once | INTRAVENOUS | Status: AC | PRN
  Administered 2017-05-25: 19 mL via INTRAVENOUS

## 2017-06-17 ENCOUNTER — Other Ambulatory Visit: Payer: Self-pay | Admitting: Internal Medicine

## 2017-06-17 DIAGNOSIS — R42 Dizziness and giddiness: Secondary | ICD-10-CM

## 2017-06-17 DIAGNOSIS — R5383 Other fatigue: Secondary | ICD-10-CM

## 2017-06-21 ENCOUNTER — Other Ambulatory Visit: Payer: PRIVATE HEALTH INSURANCE

## 2017-06-22 ENCOUNTER — Ambulatory Visit: Payer: PRIVATE HEALTH INSURANCE | Admitting: Internal Medicine

## 2017-07-07 ENCOUNTER — Encounter: Payer: Self-pay | Admitting: Internal Medicine

## 2017-08-02 ENCOUNTER — Encounter: Payer: Self-pay | Admitting: Internal Medicine

## 2018-10-17 IMAGING — CR DG CHEST 2V
1 series · 2 of 2 positions shown · non-contrast
Comparison: 07/02/2017

CLINICAL DATA: States left sided chest pain that radiates under her
left breast and down his left arm, states the pain woke him up in
the middle of the night, states intermittent episodes of same for
the past month. Never a smoker

EXAM:
CHEST  2 VIEW

[Series 1: dg chest 2 view · 0.14mm/px · 2 of 2 slices shown]
[im 1/2]
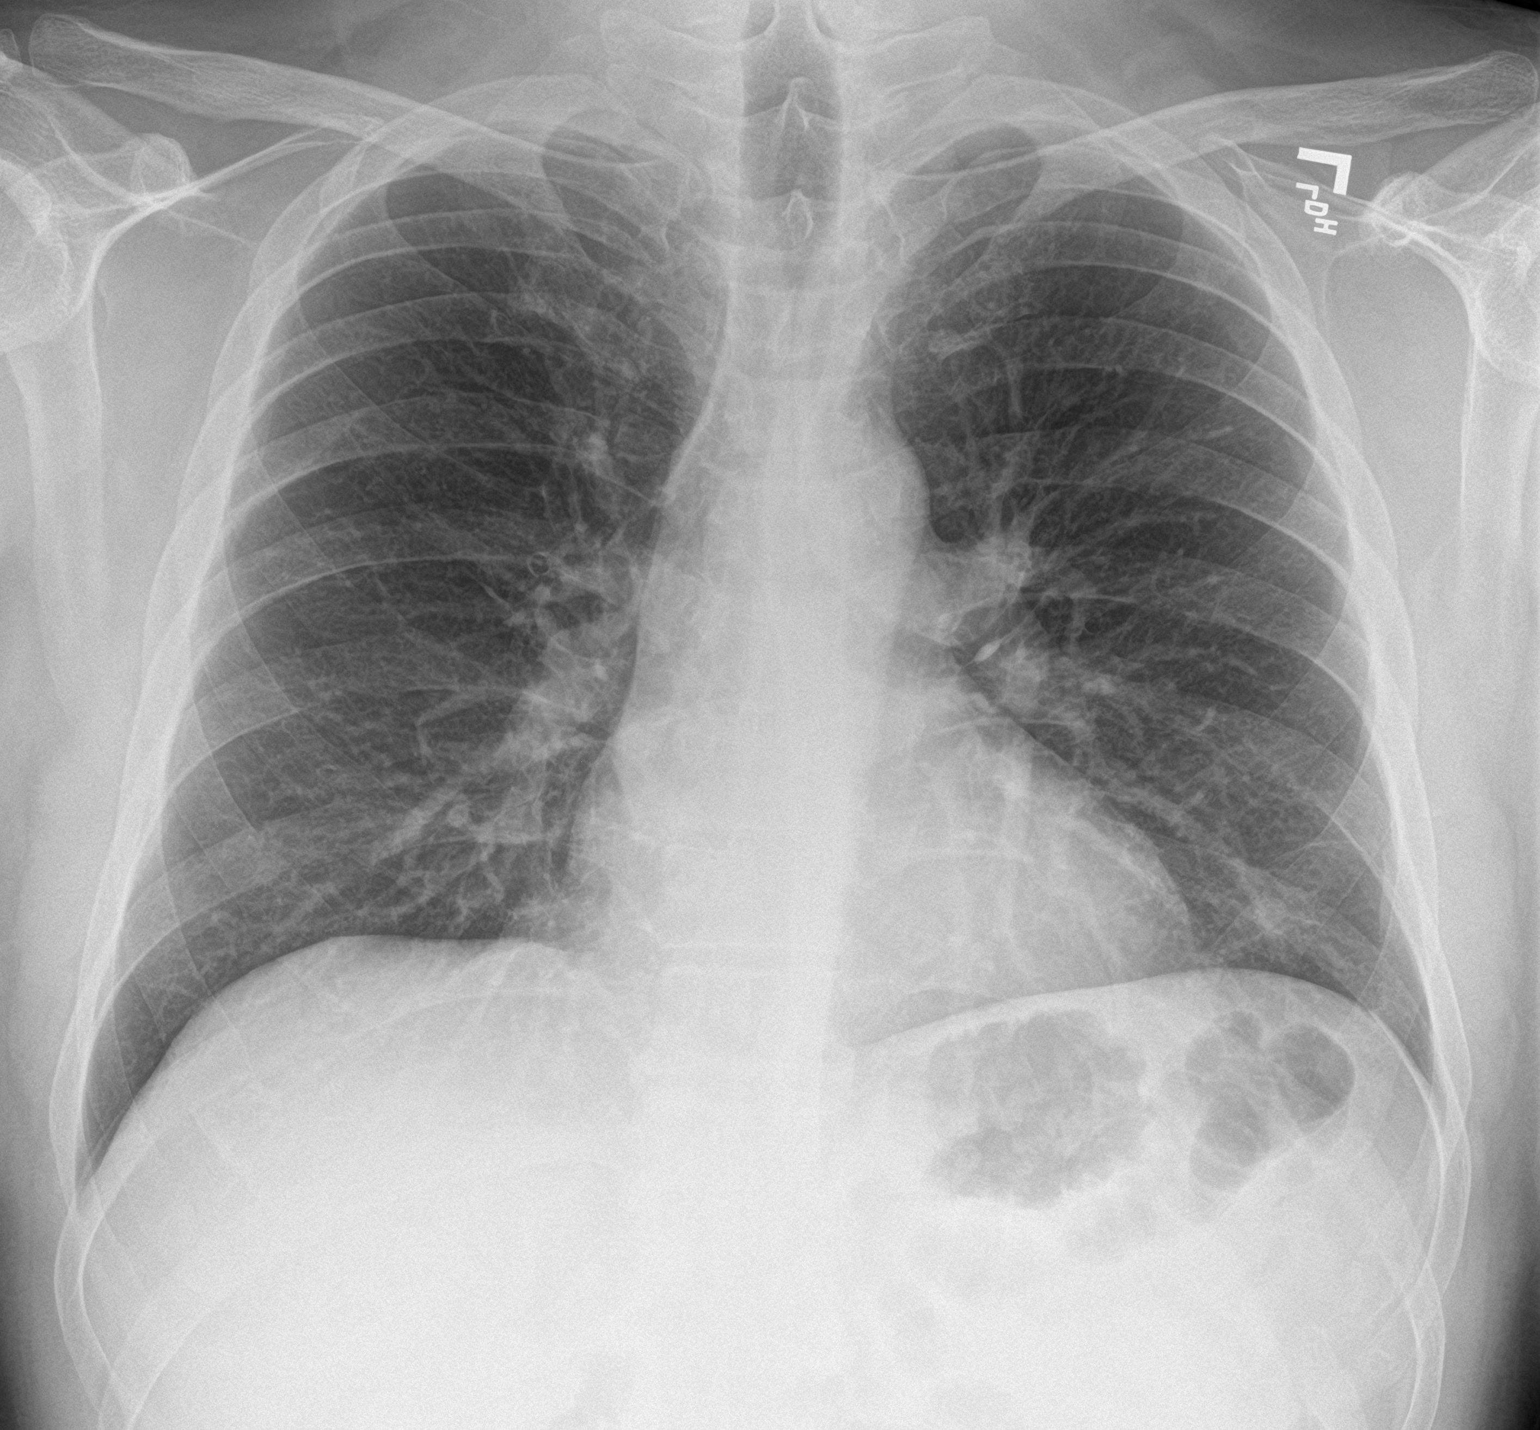
[im 2/2]
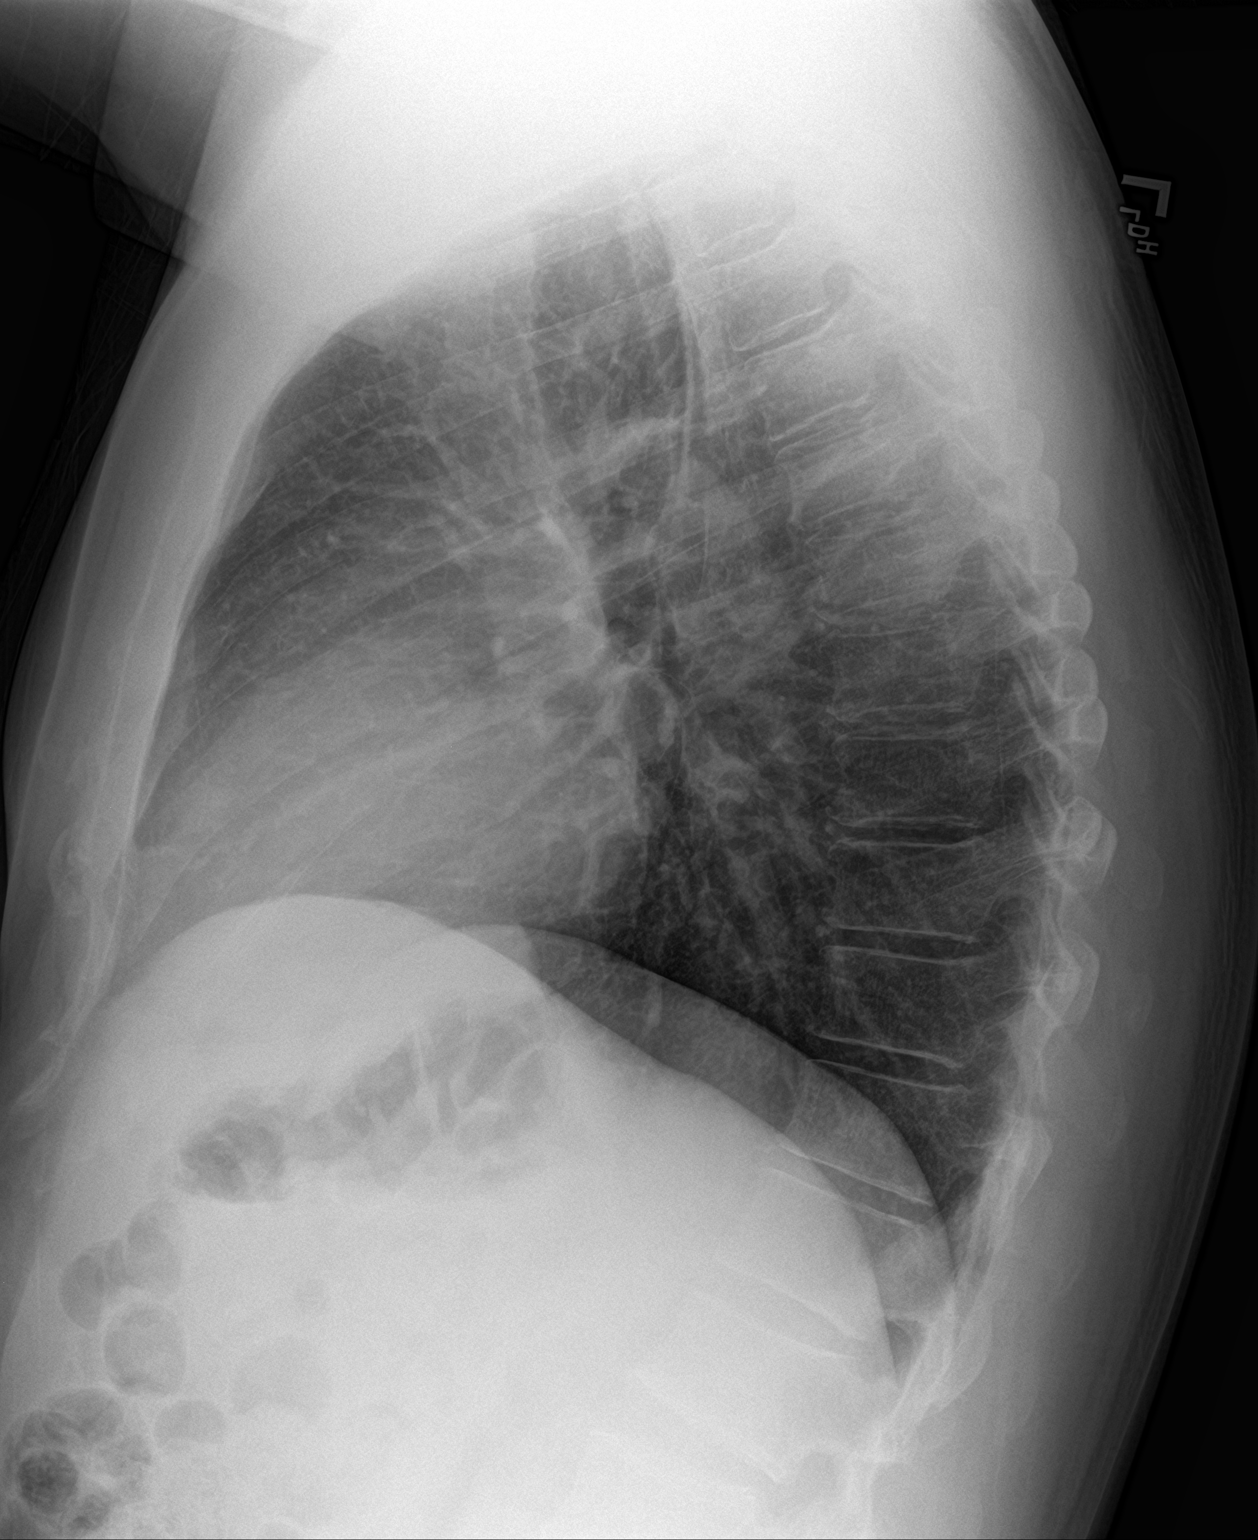

[2 of 2 positions shown; findings below may reference images not displayed]

FINDINGS: The heart size and mediastinal contours are within normal limits.
Both lungs are clear. The visualized skeletal structures are
unremarkable.
IMPRESSION: No active cardiopulmonary disease.

## 2018-10-17 IMAGING — CT CT HEAD W/O CM
3 series · 14 of 47 positions shown, 16 images · non-contrast
Comparison: No priors.

CLINICAL DATA: 50-year-old male with history of chest pain and left
upper extremity numbness this morning. Difficulty finding words.

EXAM:
CT HEAD WITHOUT CONTRAST
TECHNIQUE: Contiguous axial images were obtained from the base of the skull
through the vertex without intravenous contrast.

[Series 2: head wo · axial · 0.47mm/px · z∈[-151,-21]mm · 8 of 32 slices shown, 10 images]
[im 3/32  brain]
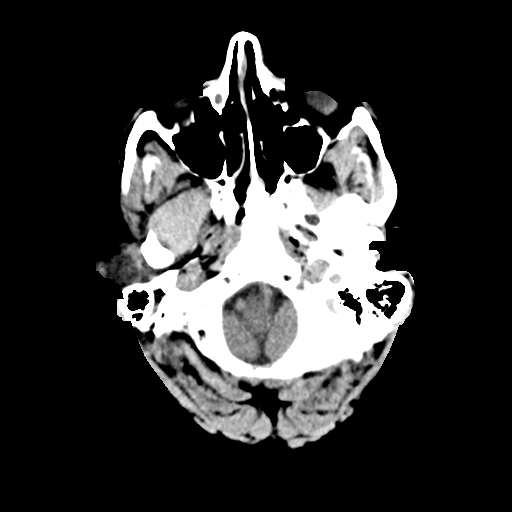
[im 3/32  bone]
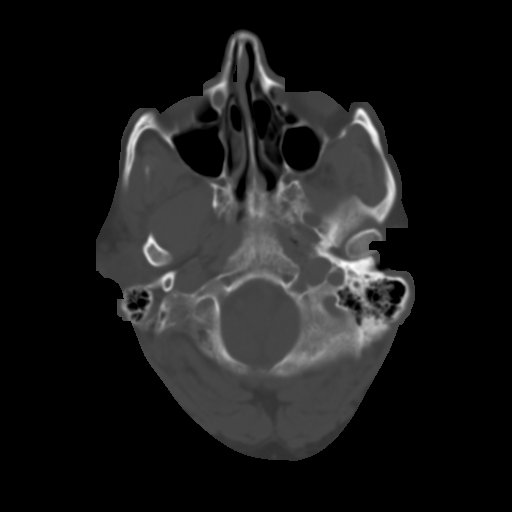
[im 7/32  brain]
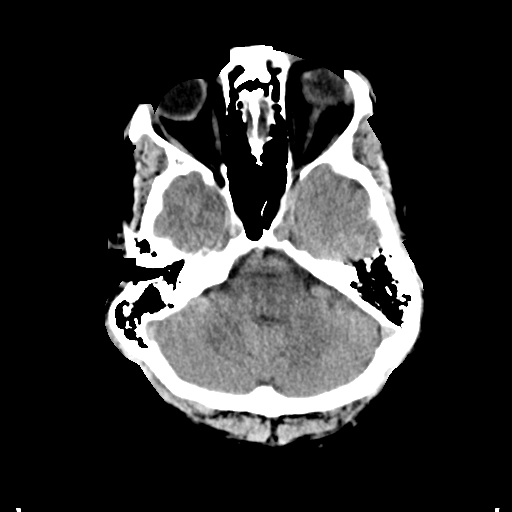
[im 10/32  brain]
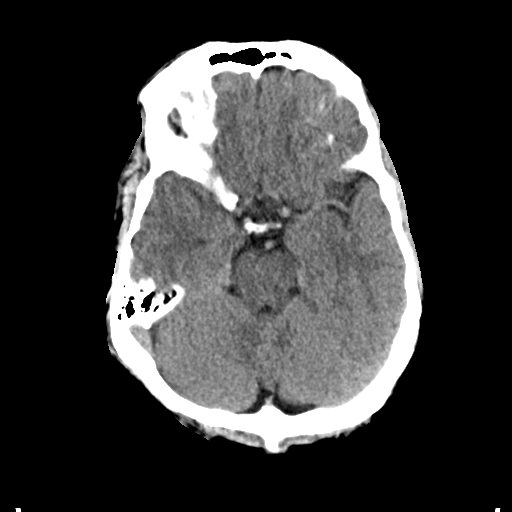
[im 14/32  brain]
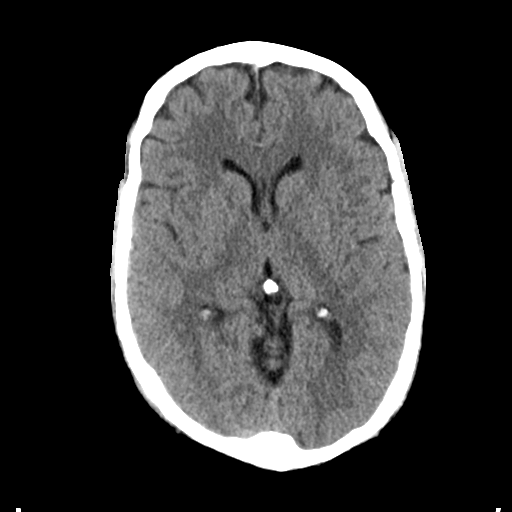
[im 18/32  brain]
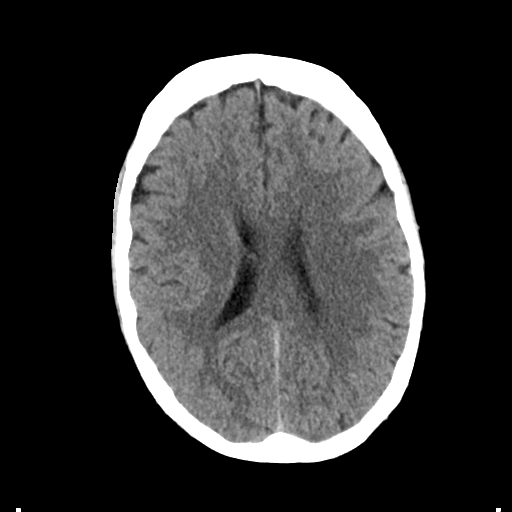
[im 18/32  bone]
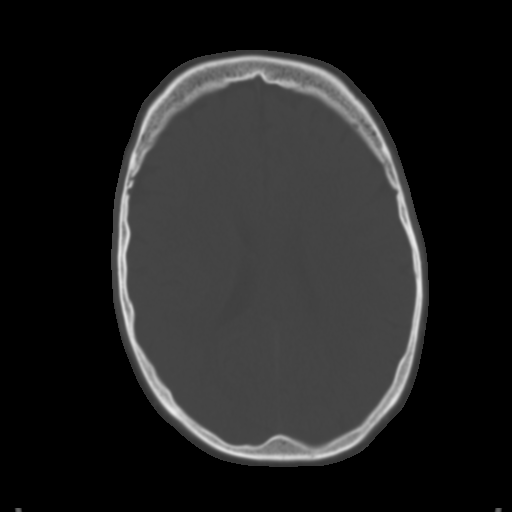
[im 22/32  brain]
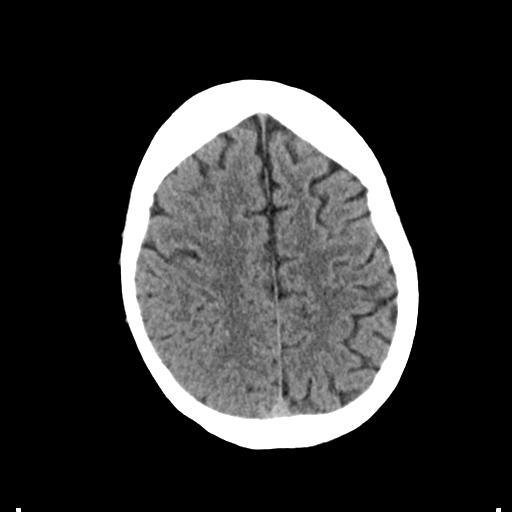
[im 25/32  brain]
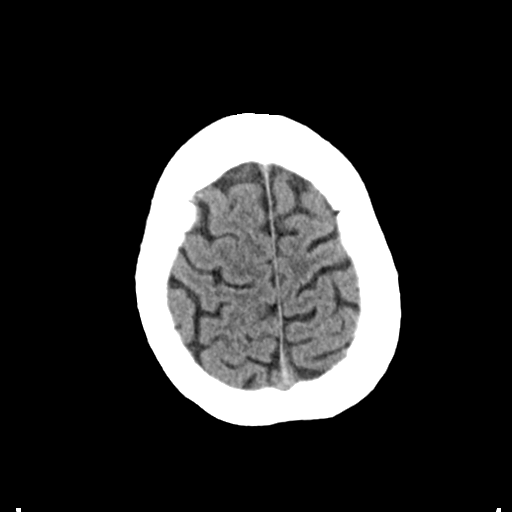
[im 29/32  brain]
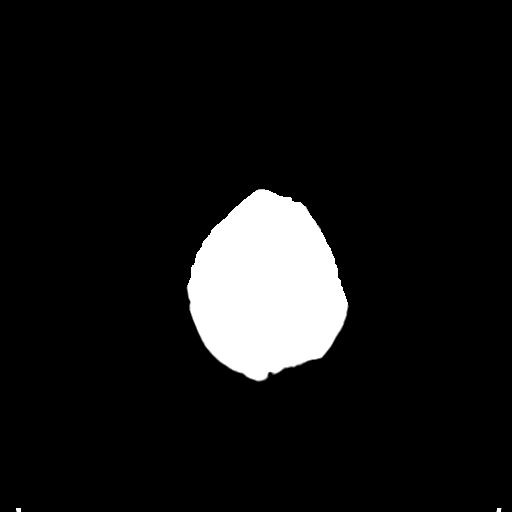

[Series 4: coronal soft tissue · coronal · 0.31mm/px · 3 of 65 slices shown]
[im 22/65  brain]
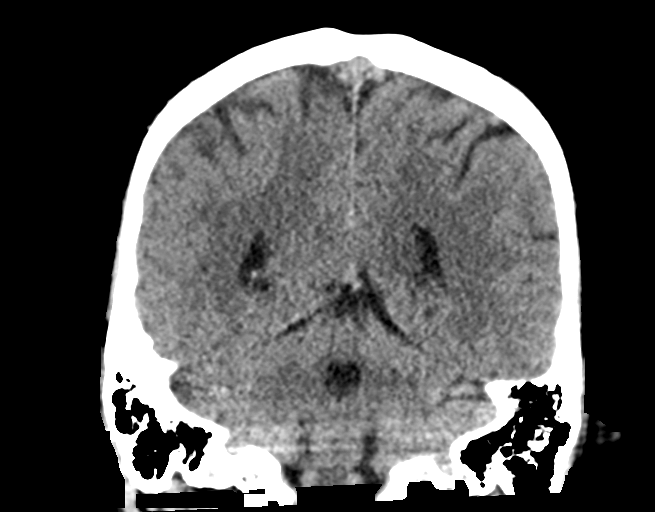
[im 29/65  brain]
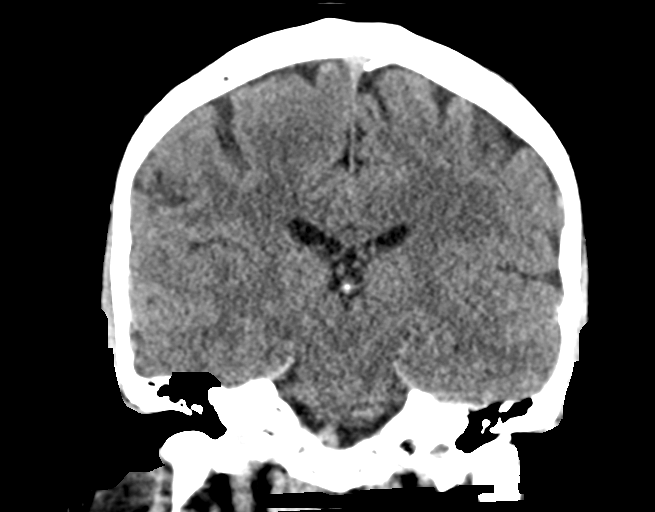
[im 36/65  brain]
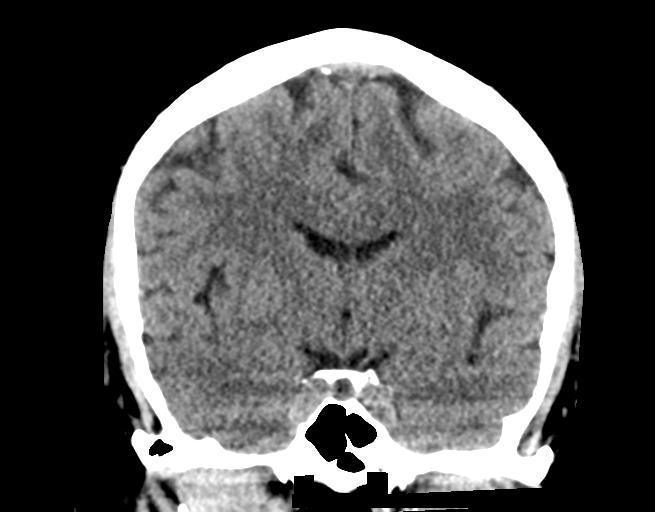

[Series 5: sagittal soft tissue · sagittal · 0.31mm/px · 3 of 51 slices shown]
[im 17/51  brain]
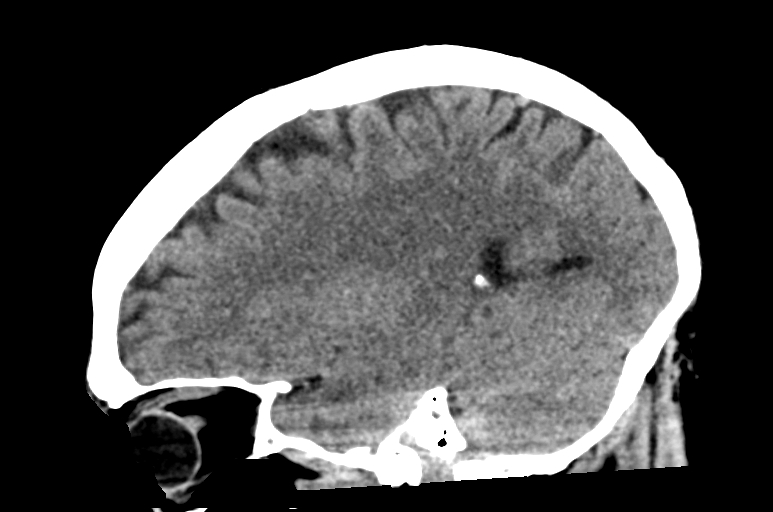
[im 26/51  brain]
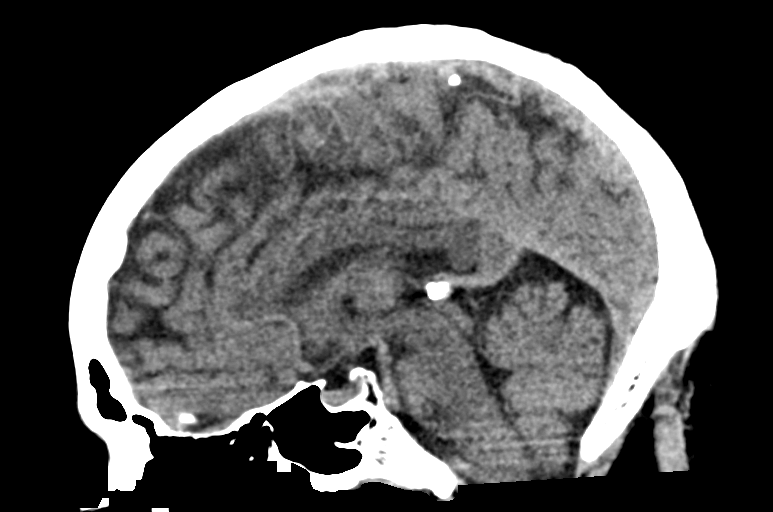
[im 34/51  brain]
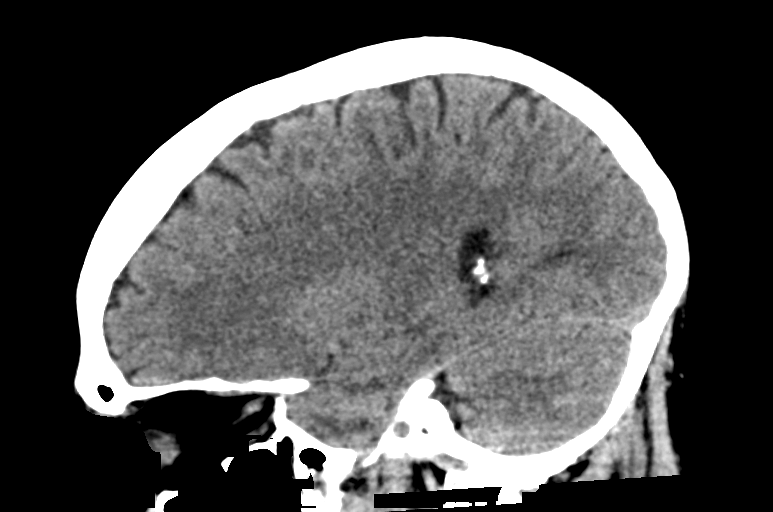

[14 of 47 positions shown; findings below may reference images not displayed]

FINDINGS: Brain: No evidence of acute infarction, hemorrhage, hydrocephalus,
extra-axial collection or mass lesion/mass effect.

Vascular: No hyperdense vessel or unexpected calcification.

Skull: Normal. Negative for fracture or focal lesion.

Sinuses/Orbits: No acute finding.

Other: None.
IMPRESSION: 1. No acute intracranial abnormalities.
2. Normal appearance of the brain.

## 2018-10-20 ENCOUNTER — Other Ambulatory Visit: Payer: Self-pay | Admitting: Orthopedic Surgery

## 2018-10-20 DIAGNOSIS — S8992XA Unspecified injury of left lower leg, initial encounter: Secondary | ICD-10-CM

## 2018-10-20 DIAGNOSIS — M25362 Other instability, left knee: Secondary | ICD-10-CM

## 2018-10-20 DIAGNOSIS — S8392XA Sprain of unspecified site of left knee, initial encounter: Secondary | ICD-10-CM

## 2018-10-20 DIAGNOSIS — M2352 Chronic instability of knee, left knee: Secondary | ICD-10-CM

## 2018-10-24 ENCOUNTER — Ambulatory Visit
Admission: RE | Admit: 2018-10-24 | Discharge: 2018-10-24 | Disposition: A | Discharge status: Home / Self Care | Payer: 59 | Source: Ambulatory Visit | Attending: Orthopedic Surgery | Admitting: Orthopedic Surgery

## 2018-10-24 DIAGNOSIS — M948X8 Other specified disorders of cartilage, other site: Secondary | ICD-10-CM | POA: Diagnosis not present

## 2018-10-24 DIAGNOSIS — M25362 Other instability, left knee: Secondary | ICD-10-CM | POA: Diagnosis present

## 2018-10-24 DIAGNOSIS — S8392XA Sprain of unspecified site of left knee, initial encounter: Secondary | ICD-10-CM | POA: Diagnosis not present

## 2018-10-24 DIAGNOSIS — X58XXXA Exposure to other specified factors, initial encounter: Secondary | ICD-10-CM | POA: Diagnosis not present

## 2018-10-24 DIAGNOSIS — S8992XA Unspecified injury of left lower leg, initial encounter: Secondary | ICD-10-CM | POA: Insufficient documentation

## 2018-10-24 DIAGNOSIS — M2352 Chronic instability of knee, left knee: Secondary | ICD-10-CM

## 2018-10-31 ENCOUNTER — Encounter: Payer: Self-pay | Admitting: *Deleted

## 2018-10-31 ENCOUNTER — Other Ambulatory Visit: Payer: Self-pay

## 2018-11-01 NOTE — Anesthesia Preprocedure Evaluation (Addendum)
Anesthesia Evaluation  Patient identified by MRN, date of birth, ID band Patient awake    Reviewed: Allergy & Precautions, H&P , NPO status , Patient's Chart, lab work & pertinent test results  Airway Mallampati: II  TM Distance: >3 FB Neck ROM: full    Dental no notable dental hx.    Pulmonary    Pulmonary exam normal breath sounds clear to auscultation       Cardiovascular + CAD (mild nonobstructive)  Normal cardiovascular exam Rhythm:regular Rate:Normal     Neuro/Psych Hx vertigo 2018; none recently    GI/Hepatic   Endo/Other    Renal/GU      Musculoskeletal   Abdominal   Peds  Hematology   Anesthesia Other Findings Cardiology note 05/03/17:  ASSESSMENT AND PLAN: Nonobstructive coronary artery disease Mr. Louthan's not had any recurrence of chest pain. Heart catheterization last month showed mild CAD involving the LCx and RCA. He was intolerant of metoprolol due to a rash with itching. He remains on aspirin and rosuvastatin, which he is tolerating well. We will continue with primary prevention. No further workup at this time.  Dizziness, fatigue, palpitations, and left arm paresthesia Symptoms are nonspecific and have both an orthostatic component and potentially an element of vertigo. MRI the ED yesterday showed slightly premature age atrophy. No acute intracranial findings were identified. Orthostatic vital signs today are normal. Given intermittent palpitations, though not clearly related to his dizziness, we have agreed to obtain a transthoracic echocardiogram and 14-day event monitor. I have also advised Mr. Cowger to see a neurologist for further evaluation. I will defer the decision to obtain carotid Dopplers to the neurology consultation.  Hyperlipidemia Recent lipid panel was notable for an LDL of 170 and triglycerides of 191. Given CAD, albeit mild, we will continue with rosuvastatin. We will plan for  repeat lipid panel in about 3 months to assess response.  Follow-up: Return to clinic in 6 weeks.  Yvonne Kendall, MD 05/04/2017  No symptoms since last year.  Regular activity and >4 METs without any recurrence of previous symptoms or anginal equivalents.  Reproductive/Obstetrics                           Anesthesia Physical Anesthesia Plan  ASA: II  Anesthesia Plan: General LMA   Post-op Pain Management:  Regional for Post-op pain   Induction:   PONV Risk Score and Plan: 2 and Ondansetron, Dexamethasone and Treatment may vary due to age or medical condition  Airway Management Planned:   Additional Equipment:   Intra-op Plan:   Post-operative Plan:   Informed Consent: I have reviewed the patients History and Physical, chart, labs and discussed the procedure including the risks, benefits and alternatives for the proposed anesthesia with the patient or authorized representative who has indicated his/her understanding and acceptance.     Plan Discussed with: CRNA  Anesthesia Plan Comments: (Fem NB for POP, Req by surg)       Anesthesia Quick Evaluation

## 2018-11-02 ENCOUNTER — Encounter
Admission: RE | Disposition: A | Discharge status: Home / Self Care | Payer: Self-pay | Source: Ambulatory Visit | Attending: Orthopedic Surgery

## 2018-11-02 ENCOUNTER — Ambulatory Visit
Admission: RE | Admit: 2018-11-02 | Discharge: 2018-11-02 | Disposition: A | Discharge status: Home / Self Care | Payer: 59 | Source: Ambulatory Visit | Attending: Orthopedic Surgery | Admitting: Orthopedic Surgery

## 2018-11-02 ENCOUNTER — Ambulatory Visit: Payer: 59 | Admitting: Anesthesiology

## 2018-11-02 DIAGNOSIS — E663 Overweight: Secondary | ICD-10-CM | POA: Insufficient documentation

## 2018-11-02 DIAGNOSIS — W1789XA Other fall from one level to another, initial encounter: Secondary | ICD-10-CM | POA: Diagnosis not present

## 2018-11-02 DIAGNOSIS — Z8673 Personal history of transient ischemic attack (TIA), and cerebral infarction without residual deficits: Secondary | ICD-10-CM | POA: Diagnosis not present

## 2018-11-02 DIAGNOSIS — Z87891 Personal history of nicotine dependence: Secondary | ICD-10-CM | POA: Diagnosis not present

## 2018-11-02 DIAGNOSIS — I2510 Atherosclerotic heart disease of native coronary artery without angina pectoris: Secondary | ICD-10-CM | POA: Insufficient documentation

## 2018-11-02 DIAGNOSIS — Z791 Long term (current) use of non-steroidal anti-inflammatories (NSAID): Secondary | ICD-10-CM | POA: Insufficient documentation

## 2018-11-02 DIAGNOSIS — Z79891 Long term (current) use of opiate analgesic: Secondary | ICD-10-CM | POA: Insufficient documentation

## 2018-11-02 DIAGNOSIS — S83242A Other tear of medial meniscus, current injury, left knee, initial encounter: Secondary | ICD-10-CM | POA: Diagnosis present

## 2018-11-02 DIAGNOSIS — Z6826 Body mass index (BMI) 26.0-26.9, adult: Secondary | ICD-10-CM | POA: Insufficient documentation

## 2018-11-02 HISTORY — DX: Personal history of other venous thrombosis and embolism: Z86.718

## 2018-11-02 HISTORY — PX: KNEE ARTHROSCOPY WITH MENISCAL REPAIR: SHX5653

## 2018-11-02 SURGERY — ARTHROSCOPY, KNEE, WITH MENISCUS REPAIR
Anesthesia: General | Site: Knee | Laterality: Left

## 2018-11-02 MED ORDER — FENTANYL CITRATE (PF) 100 MCG/2ML IJ SOLN
50.0000 ug | INTRAMUSCULAR | Status: DC | PRN
  Administered 2018-11-02: 25 ug via INTRAVENOUS
  Administered 2018-11-02: 50 ug via INTRAVENOUS

## 2018-11-02 MED ORDER — OXYCODONE HCL 5 MG PO TABS
5.0000 mg | ORAL_TABLET | Freq: Once | ORAL | Status: AC
  Administered 2018-11-02: 5 mg via ORAL

## 2018-11-02 MED ORDER — LACTATED RINGERS IV SOLN
INTRAVENOUS | Status: DC
  Administered 2018-11-02 (×2): via INTRAVENOUS

## 2018-11-02 MED ORDER — ASPIRIN EC 325 MG PO TBEC: 325.0000 mg | DELAYED_RELEASE_TABLET | Freq: Every day | ORAL | 0 refills | Status: AC

## 2018-11-02 MED ORDER — DEXTROSE 5 % IV SOLN
2000.0000 mg | Freq: Once | INTRAVENOUS | Status: AC
  Administered 2018-11-02: 2000 mg via INTRAVENOUS

## 2018-11-02 MED ORDER — HYDROCODONE-ACETAMINOPHEN 5-325 MG PO TABS: 1.0000 | ORAL_TABLET | ORAL | 0 refills | Status: DC | PRN

## 2018-11-02 MED ORDER — IBUPROFEN 800 MG PO TABS: 800.0000 mg | ORAL_TABLET | Freq: Three times a day (TID) | ORAL | 0 refills | Status: AC

## 2018-11-02 MED ORDER — ACETAMINOPHEN 500 MG PO TABS: 1000.0000 mg | ORAL_TABLET | Freq: Three times a day (TID) | ORAL | 2 refills | Status: AC

## 2018-11-02 MED ORDER — DEXAMETHASONE SODIUM PHOSPHATE 4 MG/ML IJ SOLN
INTRAMUSCULAR | Status: DC | PRN
  Administered 2018-11-02: 4 mg via INTRAVENOUS
  Administered 2018-11-02: 4 mg via PERINEURAL

## 2018-11-02 MED ORDER — BUPIVACAINE HCL 0.5 % IJ SOLN
INTRAMUSCULAR | Status: DC | PRN
  Administered 2018-11-02: 2.5 mL

## 2018-11-02 MED ORDER — PROPOFOL 10 MG/ML IV BOLUS
INTRAVENOUS | Status: DC | PRN
  Administered 2018-11-02: 180 mg via INTRAVENOUS

## 2018-11-02 MED ORDER — LIDOCAINE HCL (CARDIAC) PF 100 MG/5ML IV SOSY
PREFILLED_SYRINGE | INTRAVENOUS | Status: DC | PRN
  Administered 2018-11-02 (×2): 40 mg via INTRATRACHEAL

## 2018-11-02 MED ORDER — PHENYLEPHRINE HCL 10 MG/ML IJ SOLN
INTRAMUSCULAR | Status: DC | PRN
  Administered 2018-11-02: 25 ug via INTRAVENOUS

## 2018-11-02 MED ORDER — FENTANYL CITRATE (PF) 100 MCG/2ML IJ SOLN
INTRAMUSCULAR | Status: DC | PRN
  Administered 2018-11-02: 50 ug via INTRAVENOUS

## 2018-11-02 MED ORDER — LIDOCAINE-EPINEPHRINE 1 %-1:100000 IJ SOLN
INTRAMUSCULAR | Status: DC | PRN
  Administered 2018-11-02: 2.5 mL

## 2018-11-02 MED ORDER — ROPIVACAINE HCL 5 MG/ML IJ SOLN
INTRAMUSCULAR | Status: DC | PRN
  Administered 2018-11-02: 40 mL via PERINEURAL

## 2018-11-02 MED ORDER — ONDANSETRON HCL 4 MG/2ML IJ SOLN
INTRAMUSCULAR | Status: DC | PRN
  Administered 2018-11-02: 4 mg via INTRAVENOUS

## 2018-11-02 MED ORDER — ONDANSETRON 4 MG PO TBDP: 4.0000 mg | ORAL_TABLET | Freq: Three times a day (TID) | ORAL | 0 refills | Status: AC | PRN

## 2018-11-02 MED ORDER — MIDAZOLAM HCL 5 MG/5ML IJ SOLN
INTRAMUSCULAR | Status: DC | PRN
  Administered 2018-11-02: 1 mg via INTRAVENOUS
  Administered 2018-11-02: 2 mg via INTRAVENOUS

## 2018-11-02 SURGICAL SUPPLY — 42 items
ADAPTER IRRIG TUBE 2 SPIKE SOL (ADAPTER) ×6 IMPLANT
BANDAGE ACE 6X5 VEL STRL LF (GAUZE/BANDAGES/DRESSINGS) ×3 IMPLANT
BLADE SURG 15 STRL LF DISP TIS (BLADE) ×1 IMPLANT
BLADE SURG 15 STRL SS (BLADE) ×2
BLADE SURG SZ11 CARB STEEL (BLADE) ×3 IMPLANT
BNDG COHESIVE 4X5 TAN STRL (GAUZE/BANDAGES/DRESSINGS) ×3 IMPLANT
BNDG ESMARK 6X12 TAN STRL LF (GAUZE/BANDAGES/DRESSINGS) ×3 IMPLANT
BRACE KNEE POST OP SHORT (BRACE) ×3 IMPLANT
BUR RADIUS 3.5 (BURR) ×3 IMPLANT
CHLORAPREP W/TINT 26ML (MISCELLANEOUS) ×3 IMPLANT
COOLER POLAR GLACIER W/PUMP (MISCELLANEOUS) ×3 IMPLANT
COVER LIGHT HANDLE UNIVERSAL (MISCELLANEOUS) ×6 IMPLANT
CUFF TOURN SGL QUICK 30 (MISCELLANEOUS) ×2
CUFF TRNQT CYL LO 30X4X (MISCELLANEOUS) ×1 IMPLANT
CUTTER SUT KNOT PUSHER AIR (CUTTER) ×3 IMPLANT
DEVICE MENISCAL CVD UP (Anchor) ×9 IMPLANT
DRAPE IMP U-DRAPE 54X76 (DRAPES) ×3 IMPLANT
GAUZE SPONGE 4X4 12PLY STRL (GAUZE/BANDAGES/DRESSINGS) ×3 IMPLANT
GLOVE BIO SURGEON STRL SZ7.5 (GLOVE) ×6 IMPLANT
GLOVE BIOGEL PI IND STRL 8 (GLOVE) ×2 IMPLANT
GLOVE BIOGEL PI INDICATOR 8 (GLOVE) ×4
GOWN STRL REUS W/ TWL LRG LVL3 (GOWN DISPOSABLE) ×1 IMPLANT
GOWN STRL REUS W/ TWL XL LVL3 (GOWN DISPOSABLE) ×1 IMPLANT
GOWN STRL REUS W/TWL LRG LVL3 (GOWN DISPOSABLE) ×2
GOWN STRL REUS W/TWL XL LVL3 (GOWN DISPOSABLE) ×2
IV LACTATED RINGER IRRG 3000ML (IV SOLUTION) ×12
IV LR IRRIG 3000ML ARTHROMATIC (IV SOLUTION) ×6 IMPLANT
KIT TURNOVER KIT A (KITS) ×3 IMPLANT
MAT ABSORB  FLUID 56X50 GRAY (MISCELLANEOUS) ×4
MAT ABSORB FLUID 56X50 GRAY (MISCELLANEOUS) ×2 IMPLANT
NEPTUNE MANIFOLD (MISCELLANEOUS) ×3 IMPLANT
PACK ARTHROSCOPY KNEE (MISCELLANEOUS) ×3 IMPLANT
PAD WRAPON POLAR KNEE (MISCELLANEOUS) ×1 IMPLANT
PADDING CAST BLEND 6X4 STRL (MISCELLANEOUS) ×1 IMPLANT
PADDING STRL CAST 6IN (MISCELLANEOUS) ×2
SET TUBE SUCT SHAVER OUTFL 24K (TUBING) ×3 IMPLANT
SET TUBE TIP INTRA-ARTICULAR (MISCELLANEOUS) ×3 IMPLANT
SUT ETHILON 3-0 FS-10 30 BLK (SUTURE) ×3
SUTURE EHLN 3-0 FS-10 30 BLK (SUTURE) ×1 IMPLANT
TUBING ARTHRO INFLOW-ONLY STRL (TUBING) ×3 IMPLANT
WAND HAND CNTRL MULTIVAC 90 (MISCELLANEOUS) ×3 IMPLANT
WRAPON POLAR PAD KNEE (MISCELLANEOUS) ×3

## 2018-11-02 NOTE — Transfer of Care (Signed)
Immediate Anesthesia Transfer of Care Note  Patient: Albert Burton  Procedure(s) Performed: KNEE ARTHROSCOPY WITH MEDIAL MENISCAL REPAIR PATELLAR CHONDROPLASTY (Left Knee)  Patient Location: PACU  Anesthesia Type: General LMA  Level of Consciousness: awake, alert  and patient cooperative  Airway and Oxygen Therapy: Patient Spontanous Breathing and Patient connected to supplemental oxygen  Post-op Assessment: Post-op Vital signs reviewed, Patient's Cardiovascular Status Stable, Respiratory Function Stable, Patent Airway and No signs of Nausea or vomiting  Post-op Vital Signs: Reviewed and stable  Complications: No apparent anesthesia complications

## 2018-11-02 NOTE — Op Note (Signed)
Operative Note    SURGERY DATE: 11/02/2018   PRE-OP DIAGNOSIS:  1. Left medial meniscus tear 2. Left patella degenerative changes  POST-OP DIAGNOSIS:  1. Left medial meniscus tear 2. Left patella degenerative changes   PROCEDURES:  1. Left knee arthroscopy, medial meniscus repair 2. Left knee patellar chondroplasty   SURGEON: Rosealee Albee, MD   ANESTHESIA: Gen   ESTIMATED BLOOD LOSS: minimal   TOTAL IV FLUIDS: per anesthesia   INDICATION(S): Albert Burton is a 52 y.o. male who had a twisting injury to his knee on 10/05/2018 when he stepped out of the back of a truck. Clinical exam and MRI were consistent with a medial meniscus tear.  MRI showed that the meniscus tear was at the meniscocapsular junction.  After discussion of risks, benefits, and alternatives to surgery, the patient elected to proceed.  The patient understands that there is a risk re-tear with meniscus repair as well as incomplete pain relief.  The patient is willing to perform the appropriate rehab and maintain weight-bearing restrictions post-operatively.   OPERATIVE FINDINGS:    Examination under anesthesia: A careful examination under anesthesia was performed.  Passive range of motion was: Hyperextension: 2.  Extension: 0.  Flexion: 130.  Lachman: normal. Pivot Shift: normal.  Posterior drawer: normal.  Varus stability in full extension: normal.  Varus stability in 30 degrees of flexion: normal.  Valgus stability in full extension: normal.  Valgus stability in 30 degrees of flexion: normal.   Intra-operative findings: A thorough arthroscopic examination of the knee was performed.  The findings are: 1. Suprapatellar pouch: Normal 2. Undersurface of median ridge: Grade 2 degenerative changes 3. Medial patellar facet: Grade 1 degenerative changes 4. Lateral patellar facet: Diffuse grade 2-3 degenerative changes 5. Trochlea: Normal 6. Lateral gutter/popliteus tendon: Normal 7. Hoffa's fat pad: Inflamed and  hypertrophic 8. Medial gutter/plica: Normal 9. ACL: Normal 10. PCL: Normal 11. Medial meniscus: Small vertical tear of the posterior horn near the posterior aspect of the meniscus at the meniscocapsular junction 12. Medial compartment cartilage: Grade 1 softening of the tibial plateau 13. Lateral meniscus: Normal 14. Lateral compartment cartilage: Normal   OPERATIVE REPORT:     I identified Albert Burton in the pre-operative holding area.  I marked the operative knee with my initials. I reviewed the risks and benefits of the proposed surgical intervention, and the patient wished to proceed. The patient was transferred to the operative suite and placed in the supine position with all bony prominences padded.  Anesthesia was administered. Appropriate IV antibiotics were administered within 30 minutes of incision. The extremity was then prepped and draped in standard fashion. A time out was performed confirming the correct extremity, correct patient, and correct procedure.   Arthroscopy portals were marked. Local anesthetic was injected to the planned portal sites. The anterolateral portal was established with an 11 blade. The arthroscope was placed in the anterolateral portal and then into the suprapatellar pouch.  Next the medial portal was established under needle localization. A spinal needle was used to pie-crust the MCL to allow for better visualization and protect the cartilage surfaces during instrumentation. The medial meniscus tear was identified. A diagnostic knee scope was completed with the above findings.   A chondroplasty of the patella was performed with an oscillating shaver such that there were no loose edges and the cartilage had stable borders.  The edges of the meniscus tear and capsule were roughened with a rasp and shaver to create a more optimal  healing surface.  [employer] AIR all-inside devices x 3 were used to reduce the torn meniscus to the stable rim and capsule. The  meniscus was probed and felt to be stable. Microfracture of the intercondylar notch was then performed to allow for improved meniscus healing. Tourniquet was released with time of 46 minutes. Arthroscopic fluid was removed from the joint.   The portals were closed with 3-0 Nylon suture. Sterile dressings included Xeroform, 4x4s, Sof-Rol, and Bias wrap. A Polarcare was placed. A T-scope hinged knee brace was applied.  The patient was then awakened and taken to the PACU hemodynamically stable without complication.     POSTOPERATIVE PLAN: The patient will be discharged home today once they meet PACU criteria. Aspirin 325 mg daily was prescribed for 2 weeks for DVT prophylaxis.  Physical therapy will start on POD#3-4. TTWB x 4 weeks. F/U in 2 weeks.

## 2018-11-02 NOTE — Anesthesia Procedure Notes (Signed)
Procedure Name: LMA Insertion Date/Time: 11/02/2018 1:29 PM Performed by: Maree Krabbe, CRNA Pre-anesthesia Checklist: Patient identified, Emergency Drugs available, Suction available, Timeout performed and Patient being monitored Patient Re-evaluated:Patient Re-evaluated prior to induction Oxygen Delivery Method: Circle system utilized Preoxygenation: Pre-oxygenation with 100% oxygen Induction Type: IV induction LMA: LMA inserted LMA Size: 5.0 Number of attempts: 1 Placement Confirmation: positive ETCO2 and breath sounds checked- equal and bilateral Tube secured with: Tape Dental Injury: Teeth and Oropharynx as per pre-operative assessment

## 2018-11-02 NOTE — Anesthesia Procedure Notes (Signed)
Anesthesia Regional Block: Popliteal block (IPAC block)   Pre-Anesthetic Checklist: ,, timeout performed, Correct Patient, Correct Site, Correct Laterality, Correct Procedure, Correct Position, site marked, Risks and benefits discussed,  Surgical consent,  Pre-op evaluation,  At surgeon's request and post-op pain management  Laterality: Left  Prep: chloraprep       Needles:  Injection technique: Single-shot  Needle Type: Echogenic Needle     Needle Length: 9cm  Needle Gauge: 21     Additional Needles:   Procedures:,,,, ultrasound used (permanent image in chart),,,,  Narrative:  Start time: 11/02/2018 12:25 PM End time: 11/02/2018 12:38 PM Injection made incrementally with aspirations every 5 mL.  Performed by: Personally  Anesthesiologist: Ranee Gosselin, MD  Additional Notes: Functioning IV was confirmed and monitors applied. Ultrasound guidance: relevant anatomy identified, needle position confirmed, local anesthetic spread visualized around nerve(s)., vascular puncture avoided.  Image printed for medical record.  Negative aspiration and no paresthesias; incremental administration of local anesthetic. The patient tolerated the procedure well. Vitals signes recorded in RN notes.  20 ml for [facility]

## 2018-11-02 NOTE — Discharge Instructions (Signed)
Arthroscopic Knee Surgery - Meniscus Repair   Post-Op Instructions   1. Bracing or crutches: Crutches will be provided at the time of discharge from the surgery center. Keep brace locked in extension at all times except as directed by physical therapy.    2. Ice: You may be provided with a device [facility]) that allows you to ice the affected area effectively. Otherwise you can ice manually.    3. Driving:  Plan on not driving for at least 1-2 weeks. Please note that you are advised NOT to drive while taking narcotic pain medications as you may be impaired and unsafe to drive.   4. Activity: Ankle pumps several times an hour while awake to prevent blood clots. Weight bearing: TOE-TOUCH WEIGHT BEARING FOR 4 WEEKS. Use crutches for at least 4 weeks, if not 6 based on your surgery. Bending and straightening the knee is unlimited, but do not flex your knee past 90 degrees until cleared by your therapist. Elevate knee above heart level as much as possible for one week. Avoid standing more than 5 minutes (consecutively) for the first week. No exercise involving the knee until cleared by the surgeon or physical therapist.  Avoid long distance travel for 4 weeks.   5. Medications:  - Take ibuprofen 800 mg every 8 hours with food to reduce post-operative knee swelling. DO NOT STOP IBUPROFEN POST-OP UNTIL INSTRUCTED TO DO SO at first post-op office visit (10-14 days after surgery).  - Take enteric coated aspirin 325 mg once daily for 2 weeks to prevent blood clots.  -Take tylenol 1000 every 8 hours for pain.  May stop tylenol 3 days after surgery or when you are having minimal pain. If your narcotic has tylenol (acetaminophen), please do not take a total of more than 3000mg /day of tylenol.  - You have been provided a prescription for narcotic pain medicine. After surgery, take 1-2 narcotic tablets every 4 hours if needed for severe pain. If it has tylenol (acetaminophen), please do not take a total of more  than 3000mg /day of tylenol.  - A prescription for anti-nausea medication will be provided in case the narcotic medicine causes nausea - take 1 tablet every 6 hours only if nauseated.    6. Bandages: The physical therapist should change the bandages at the first post-op appointment. If needed, the dressing supplies have been provided to you. You may shower after this with waterproof bandaids covering the incisions.    7. Physical Therapy: 2 times per week for the first 4 weeks, then 1-2 times per week from weeks 4-8 post-op. Therapy typically starts on post operative Day 3 or 4. You have been provided an order for physical therapy. The therapist will provide home exercises.   8. Work: May return to full work when off of crutches. May do light duty/desk job in approximately 1-2 weeks when off of narcotics, pain is well-controlled, and swelling has decreased.   9. Post-Op Appointments: Your first post-op appointment will be with Dr. Allena Katz in approximately 2 weeks time.    If you find that they have not been scheduled please call the Orthopaedic Appointment front desk at [phone].      General Anesthesia, Adult, Care After These instructions provide you with information about caring for yourself after your procedure. Your health care provider may also give you more specific instructions. Your treatment has been planned according to current medical practices, but problems sometimes occur. Call your health care provider if you have any problems or  questions after your procedure. What can I expect after the procedure? After the procedure, it is common to have:  Vomiting.  A sore throat.  Mental slowness.  It is common to feel:  Nauseous.  Cold or shivery.  Sleepy.  Tired.  Sore or achy, even in parts of your body where you did not have surgery.  Follow these instructions at home: For at least 24 hours after the procedure:  Do not: ? Participate in activities where you could  fall or become injured. ? Drive. ? Use heavy machinery. ? Drink alcohol. ? Take sleeping pills or medicines that cause drowsiness. ? Make important decisions or sign legal documents. ? Take care of children on your own.  Rest. Eating and drinking  If you vomit, drink water, juice, or soup when you can drink without vomiting.  Drink enough fluid to keep your urine clear or pale yellow.  Make sure you have little or no nausea before eating solid foods.  Follow the diet recommended by your health care provider. General instructions  Have a responsible adult stay with you until you are awake and alert.  Return to your normal activities as told by your health care provider. Ask your health care provider what activities are safe for you.  Take over-the-counter and prescription medicines only as told by your health care provider.  If you smoke, do not smoke without supervision.  Keep all follow-up visits as told by your health care provider. This is important. Contact a health care provider if:  You continue to have nausea or vomiting at home, and medicines are not helpful.  You cannot drink fluids or start eating again.  You cannot urinate after 8-12 hours.  You develop a skin rash.  You have fever.  You have increasing redness at the site of your procedure. Get help right away if:  You have difficulty breathing.  You have chest pain.  You have unexpected bleeding.  You feel that you are having a life-threatening or urgent problem. This information is not intended to replace advice given to you by your health care provider. Make sure you discuss any questions you have with your health care provider. Document Released: 03/21/2001 Document Revised: 05/17/2016 Document Reviewed: 11/27/2015 Elsevier Interactive Patient Education  [employer].

## 2018-11-02 NOTE — H&P (Signed)
Paper H&P to be scanned into permanent record. H&P reviewed. No significant changes noted.  

## 2018-11-02 NOTE — Anesthesia Postprocedure Evaluation (Signed)
Anesthesia Post Note  Patient: Albert Burton  Procedure(s) Performed: KNEE ARTHROSCOPY WITH MEDIAL MENISCAL REPAIR PATELLAR CHONDROPLASTY (Left Knee)  Patient location during evaluation: PACU Anesthesia Type: General Level of consciousness: awake and alert and oriented Pain management: satisfactory to patient Vital Signs Assessment: post-procedure vital signs reviewed and stable Respiratory status: spontaneous breathing, nonlabored ventilation and respiratory function stable Cardiovascular status: blood pressure returned to baseline and stable Postop Assessment: Adequate PO intake and No signs of nausea or vomiting Anesthetic complications: no    Cherly Beach

## 2018-11-02 NOTE — Progress Notes (Signed)
Assisted Delorise Jackson ANMD with left, ultrasound guided, popliteal (IPAC) block. Side rails up, monitors on throughout procedure. See vital signs in flow sheet. Tolerated Procedure well.

## 2018-11-02 NOTE — Anesthesia Procedure Notes (Signed)
Anesthesia Regional Block: Adductor canal block   Pre-Anesthetic Checklist: ,, timeout performed, Correct Patient, Correct Site, Correct Laterality, Correct Procedure, Correct Position, site marked, Risks and benefits discussed,  Surgical consent,  Pre-op evaluation,  At surgeon's request and post-op pain management  Laterality: Right  Prep: chloraprep       Needles:  Injection technique: Single-shot  Needle Type: Echogenic Needle     Needle Length: 9cm  Needle Gauge: 21     Additional Needles:   Procedures:,,,, ultrasound used (permanent image in chart),,,,  Narrative:  Start time: 11/02/2018 12:25 PM End time: 11/02/2018 12:38 PM Injection made incrementally with aspirations every 5 mL.  Performed by: Personally  Anesthesiologist: Ranee Gosselin, MD  Additional Notes: Functioning IV was confirmed and monitors applied. Ultrasound guidance: relevant anatomy identified, needle position confirmed, local anesthetic spread visualized around nerve(s)., vascular puncture avoided.  Image printed for medical record.  Negative aspiration and no paresthesias; incremental administration of local anesthetic. The patient tolerated the procedure well. Vitals signes recorded in RN notes.  20ml for Adductor

## 2018-11-03 ENCOUNTER — Encounter: Payer: Self-pay | Admitting: Orthopedic Surgery

## 2023-02-08 ENCOUNTER — Emergency Department
Admission: EM | Admit: 2023-02-08 | Discharge: 2023-02-08 | Disposition: A | Discharge status: Home / Self Care | Payer: 59 | Attending: Emergency Medicine | Admitting: Emergency Medicine

## 2023-02-08 ENCOUNTER — Emergency Department: Payer: 59

## 2023-02-08 DIAGNOSIS — R519 Headache, unspecified: Secondary | ICD-10-CM

## 2023-02-08 DIAGNOSIS — I10 Essential (primary) hypertension: Secondary | ICD-10-CM | POA: Diagnosis not present

## 2023-02-08 DIAGNOSIS — Z79899 Other long term (current) drug therapy: Secondary | ICD-10-CM | POA: Diagnosis not present

## 2023-02-08 DIAGNOSIS — R29898 Other symptoms and signs involving the musculoskeletal system: Secondary | ICD-10-CM | POA: Diagnosis not present

## 2023-02-08 LAB — COMPREHENSIVE METABOLIC PANEL
ALT: 106 U/L — ABNORMAL HIGH (ref 0–44)
AST: 50 U/L — ABNORMAL HIGH (ref 15–41)
Albumin: 4.6 g/dL (ref 3.5–5.0)
Alkaline Phosphatase: 59 U/L (ref 38–126)
Anion gap: 12 (ref 5–15)
BUN: 18 mg/dL (ref 6–20)
CO2: 22 mmol/L (ref 22–32)
Calcium: 9.3 mg/dL (ref 8.9–10.3)
Chloride: 102 mmol/L (ref 98–111)
Creatinine, Ser: 0.77 mg/dL (ref 0.61–1.24)
GFR, Estimated: >60 mL/min (ref >60)
Glucose, Bld: 94 mg/dL (ref 70–99)
Potassium: 3.7 mmol/L (ref 3.5–5.1)
Sodium: 136 mmol/L (ref 135–145)
Total Bilirubin: 0.9 mg/dL (ref 0.3–1.2)
Total Protein: 7.9 g/dL (ref 6.5–8.1)

## 2023-02-08 LAB — ETHANOL: Alcohol, Ethyl (B): <10 mg/dL (ref <10)

## 2023-02-08 LAB — CBC
HCT: 44.9 % (ref 39.0–52.0)
Hemoglobin: 15.2 g/dL (ref 13.0–17.0)
MCH: 30.8 pg (ref 26.0–34.0)
MCHC: 33.9 g/dL (ref 30.0–36.0)
MCV: 91.1 fL (ref 80.0–100.0)
Platelets: 238 10*3/uL (ref 150–400)
RBC: 4.93 MIL/uL (ref 4.22–5.81)
RDW: 12.3 % (ref 11.5–15.5)
WBC: 8.4 10*3/uL (ref 4.0–10.5)
nRBC: 0 % (ref 0.0–0.2)

## 2023-02-08 LAB — TROPONIN I (HIGH SENSITIVITY)
Troponin I (High Sensitivity): 4 ng/L (ref <18)
Troponin I (High Sensitivity): 5 ng/L (ref <18)

## 2023-02-08 LAB — DIFFERENTIAL
Abs Immature Granulocytes: 0.06 10*3/uL (ref 0.00–0.07)
Basophils Absolute: 0 10*3/uL (ref 0.0–0.1)
Basophils Relative: 1 %
Eosinophils Absolute: 0.2 10*3/uL (ref 0.0–0.5)
Eosinophils Relative: 2 %
Immature Granulocytes: 1 %
Lymphocytes Relative: 32 %
Lymphs Abs: 2.7 10*3/uL (ref 0.7–4.0)
Monocytes Absolute: 0.6 10*3/uL (ref 0.1–1.0)
Monocytes Relative: 7 %
Neutro Abs: 4.8 10*3/uL (ref 1.7–7.7)
Neutrophils Relative %: 57 %

## 2023-02-08 LAB — PROTIME-INR
INR: 1 (ref 0.8–1.2)
Prothrombin Time: 12.9 seconds (ref 11.4–15.2)

## 2023-02-08 LAB — APTT: aPTT: 28 seconds (ref 24–36)

## 2023-02-08 LAB — CBG MONITORING, ED: Glucose-Capillary: 91 mg/dL (ref 70–99)

## 2023-02-08 MED ORDER — SODIUM CHLORIDE 0.9% FLUSH
3.0000 mL | Freq: Once | INTRAVENOUS | Status: AC
  Administered 2023-02-08: 3 mL via INTRAVENOUS

## 2023-02-08 MED ORDER — HYDROCODONE-ACETAMINOPHEN 5-325 MG PO TABS: 1.0000 | ORAL_TABLET | Freq: Four times a day (QID) | ORAL | 0 refills | Status: AC | PRN

## 2023-02-08 MED ORDER — AMLODIPINE BESYLATE 5 MG PO TABS
5.0000 mg | ORAL_TABLET | Freq: Once | ORAL | Status: AC
  Administered 2023-02-08: 5 mg via ORAL
  Filled 2023-02-08: qty 1

## 2023-02-08 MED ORDER — AMLODIPINE BESYLATE 5 MG PO TABS: 5.0000 mg | ORAL_TABLET | Freq: Every day | ORAL | 1 refills | Status: AC

## 2023-02-08 MED ORDER — ACETAMINOPHEN 500 MG PO TABS
1000.0000 mg | ORAL_TABLET | Freq: Once | ORAL | Status: AC
  Administered 2023-02-08: 1000 mg via ORAL
  Filled 2023-02-08: qty 2

## 2023-02-08 MED ORDER — MECLIZINE HCL 25 MG PO TABS: 25.0000 mg | ORAL_TABLET | Freq: Two times a day (BID) | ORAL | 0 refills | Status: AC | PRN

## 2023-02-08 MED ORDER — GADOBUTROL 1 MMOL/ML IV SOLN
10.0000 mL | Freq: Once | INTRAVENOUS | Status: AC | PRN
  Administered 2023-02-08: 10 mL via INTRAVENOUS

## 2023-02-08 MED ORDER — CLONIDINE HCL 0.1 MG PO TABS
0.1000 mg | ORAL_TABLET | Freq: Once | ORAL | Status: AC
  Administered 2023-02-08: 0.1 mg via ORAL
  Filled 2023-02-08: qty 1

## 2023-02-08 NOTE — ED Provider Notes (Incomplete)
[facility] Provider Note    Event Date/Time   First MD Initiated Contact with Patient 02/08/23 1355     (approximate)   History   Headache   HPI  Albert Burton is a 57 y.o. male who presents to the emergency department today because of concerns for headache, arm numbness and dizziness.  The patient states that his symptoms started yesterday.  He has intermittent squeezing type sensation in the right side of his head.  Says been accompanied with some left arm numbness.  He has been dizzy and unsteady on his feet at times.  Denies any trauma to his head denies any usual activity before the symptoms started.     Physical Exam   Triage Vital Signs: ED Triage Vitals  Enc Vitals Group     BP 02/08/23 1341 (!) 165/112     Pulse Rate 02/08/23 1341 73     Resp 02/08/23 1341 18     Temp 02/08/23 1341 98.3 F (36.8 C)     Temp Source 02/08/23 1341 Oral     SpO2 02/08/23 1341 97 %     Weight 02/08/23 1340 225 lb (102.1 kg)     Height 02/08/23 1340 6' (1.829 m)     Head Circumference --      Peak Flow --      Pain Score --      Pain Loc --      Pain Edu? --      Excl. in [place]? --     Most recent vital signs: Vitals:   02/08/23 1341  BP: (!) 165/112  Pulse: 73  Resp: 18  Temp: 98.3 F (36.8 C)  SpO2: 97%    {Only need to document appropriate and relevant physical exam:1} General: Awake, no distress. *** CV:  Good peripheral perfusion. *** Resp:  Normal effort. *** Abd:  No distention. *** Other:  ***   ED Results / Procedures / Treatments   Labs (all labs ordered are listed, but only abnormal results are displayed) Labs Reviewed  PROTIME-INR  APTT  CBC  DIFFERENTIAL  COMPREHENSIVE METABOLIC PANEL  ETHANOL  I-STAT CREATININE, ED  CBG MONITORING, ED  TROPONIN I (HIGH SENSITIVITY)     EKG  I, Nance Pear, attending physician, personally viewed and interpreted this EKG  EKG Time: 1345 Rate: 69 Rhythm: normal sinus  rhythm Axis: normal Intervals: qtc 428 QRS: narrow ST changes: no st elevation Impression: normal ekg   RADIOLOGY *** {USE THE WORD "INTERPRETED"!! You MUST document your own interpretation of imaging, as well as the fact that you reviewed the radiologist's report!:1}   PROCEDURES:  Critical Care performed: {CriticalCareYesNo:19197::"Yes, see critical care procedure note(s)","No"}  Procedures   MEDICATIONS ORDERED IN ED: Medications  sodium chloride flush (NS) 0.9 % injection 3 mL (has no administration in time range)     IMPRESSION / MDM / ASSESSMENT AND PLAN / ED COURSE  I reviewed the triage vital signs and the nursing notes.                              Differential diagnosis includes, but is not limited to, ***  Patient's presentation is most consistent with {EM COPA:27473}  *** {If the patient is on the monitor, remove the brackets and asterisks on the sentence below and remember to document it as a Procedure as well. Otherwise delete the sentence below:1} {**The patient is on the cardiac  monitor to evaluate for evidence of arrhythmia and/or significant heart rate changes.**} {Remember to include, when applicable, any/all of the following data: independent review of imaging independent review of labs (comment specifically on pertinent positives and negatives) review of specific prior hospitalizations, PCP/specialist notes, etc. discuss meds given and prescribed document any discussion with consultants (including hospitalists) any clinical decision tools you used and why (PECARN, NEXUS, etc.) did you consider admitting the patient? document social determinants of health affecting patient's care (homelessness, inability to follow up in a timely fashion, etc) document any pre-existing conditions increasing risk on current visit (e.g. diabetes and HTN increasing danger of high-risk chest pain/ACS) describes what meds you gave (especially parenteral) and why any  other interventions?:1}     FINAL CLINICAL IMPRESSION(S) / ED DIAGNOSES   Final diagnoses:  None     Rx / DC Orders   ED Discharge Orders     None        Note:  This document was prepared using Dragon voice recognition software and may include unintentional dictation errors.

## 2023-02-08 NOTE — ED Provider Notes (Signed)
Patient here with headache, likely related to symptomatic HTN. MRI obtained, reviewed, and is negative. He is well appearing with nof ocal deficits on exam. He does not check his BP regularly and suspect some of this is acute on chronic. He o/w denies any complaints. Imaging negative. Labs urnemarkable.  Discussed with pt. Will start on amlodipine for his HTN and give brief analgesia PRN as some of his sx could be from his headache (which sounds tension-like on exam). No vision changes. No apparent emergent pathology. No CP or signs of ACS, dissection, or HTN emergency.   Duffy Bruce, MD 02/08/23 2029

## 2023-02-08 NOTE — Discharge Instructions (Signed)
Follow-up with a primary doctor in 1-2 weeks

## 2023-02-08 NOTE — ED Triage Notes (Signed)
Pt sts that yesterday he started to have a headache with left arm pain/numbness. Pt is A/oX4, NAD, walks with no assistance.

## 2023-02-08 NOTE — ED Provider Notes (Signed)
[facility] Provider Note    Event Date/Time   First MD Initiated Contact with Patient 02/08/23 1355     (approximate)   History   Headache   HPI  Albert Burton is a 57 y.o. male who presents to the emergency department today because of concerns for headache, arm numbness and dizziness.  The patient states that his symptoms started yesterday.  He has intermittent squeezing type sensation in the right side of his head.  Says been accompanied with some left arm numbness.  He has been dizzy and unsteady on his feet at times.  Denies any trauma to his head denies any usual activity before the symptoms started.     Physical Exam   Triage Vital Signs: ED Triage Vitals  Enc Vitals Group     BP 02/08/23 1341 (!) 165/112     Pulse Rate 02/08/23 1341 73     Resp 02/08/23 1341 18     Temp 02/08/23 1341 98.3 F (36.8 C)     Temp Source 02/08/23 1341 Oral     SpO2 02/08/23 1341 97 %     Weight 02/08/23 1340 225 lb (102.1 kg)     Height 02/08/23 1340 6' (1.829 m)     Head Circumference --      Peak Flow --      Pain Score --      Pain Loc --      Pain Edu? --      Excl. in [place]? --     Most recent vital signs: Vitals:   02/08/23 1341 02/08/23 1357  BP: (!) 165/112 (!) 165/120  Pulse: 73   Resp: 18   Temp: 98.3 F (36.8 C)   SpO2: 97%    General: Awake, alert, oriented. CV:  Good peripheral perfusion. Regular rate and rhythm. Resp:  Normal effort. Lungs clear. Abd:  No distention.  Other:  Face symmetric. PERRL. EOMI. Strength 5/5 in right upper and lower extremity. Strength 4+/5 in left upper and lower extremity.   ED Results / Procedures / Treatments   Labs (all labs ordered are listed, but only abnormal results are displayed) Labs Reviewed  COMPREHENSIVE METABOLIC PANEL - Abnormal; Notable for the following components:      Result Value   AST 50 (*)    ALT 106 (*)    All other components within normal limits  PROTIME-INR  APTT  CBC   DIFFERENTIAL  ETHANOL  CBG MONITORING, ED  I-STAT CREATININE, ED  TROPONIN I (HIGH SENSITIVITY)  TROPONIN I (HIGH SENSITIVITY)     EKG  I, Nance Pear, attending physician, personally viewed and interpreted this EKG  EKG Time: 1345 Rate: 69 Rhythm: normal sinus rhythm Axis: normal Intervals: qtc 428 QRS: narrow ST changes: no st elevation Impression: normal ekg   RADIOLOGY I independently interpreted and visualized the CT head. My interpretation: No bleed Radiology interpretation:  IMPRESSION:  No acute intracranial abnormality.      PROCEDURES:  Critical Care performed: No  Procedures   MEDICATIONS ORDERED IN ED: Medications  sodium chloride flush (NS) 0.9 % injection 3 mL (has no administration in time range)     IMPRESSION / MDM / ASSESSMENT AND PLAN / ED COURSE  I reviewed the triage vital signs and the nursing notes.                              Differential diagnosis includes,  but is not limited to, anemia, electrolyte abnormality, intracranial process.  Patient's presentation is most consistent with acute presentation with potential threat to life or bodily function.  Patient presented to the emergency department today because of concerns for headache, left arm numbness and dizziness.  On exam patient does have some slight weakness to the left side.  CT head without concerning abnormalities.  Blood work with initial troponin negative.  No concerning leukocytosis or electrolyte abnormality.  Will plan on getting MRI to further evaluate.  MRI pending at time of signout.   FINAL CLINICAL IMPRESSION(S) / ED DIAGNOSES   Headache Dizziness    Note:  This document was prepared using Dragon voice recognition software and may include unintentional dictation errors.    Nance Pear, MD 02/08/23 1534

## 2023-02-08 NOTE — ED Notes (Addendum)
First Nurse note: Per [facility] urgent care pt has been having head pain with neck pain and left pinky pain.

## 2024-09-04 ENCOUNTER — Other Ambulatory Visit: Payer: Self-pay | Admitting: Otolaryngology

## 2024-09-04 DIAGNOSIS — J383 Other diseases of vocal cords: Secondary | ICD-10-CM

## 2024-09-05 ENCOUNTER — Encounter: Payer: Self-pay | Admitting: Otolaryngology

## 2024-09-07 ENCOUNTER — Ambulatory Visit
Admission: RE | Admit: 2024-09-07 | Discharge: 2024-09-07 | Disposition: A | Discharge status: Home / Self Care | Source: Ambulatory Visit | Attending: Otolaryngology | Admitting: Otolaryngology

## 2024-09-07 ENCOUNTER — Encounter: Payer: Self-pay | Admitting: Radiology

## 2024-09-07 ENCOUNTER — Other Ambulatory Visit

## 2024-09-07 DIAGNOSIS — J383 Other diseases of vocal cords: Secondary | ICD-10-CM

## 2024-09-07 MED ORDER — IOPAMIDOL (ISOVUE-300) INJECTION 61%
75.0000 mL | Freq: Once | INTRAVENOUS | Status: AC | PRN
  Administered 2024-09-07: 75 mL via INTRAVENOUS

## 2024-09-25 ENCOUNTER — Other Ambulatory Visit: Payer: Self-pay | Admitting: Otolaryngology

## 2024-09-25 DIAGNOSIS — R490 Dysphonia: Secondary | ICD-10-CM

## 2024-10-03 ENCOUNTER — Ambulatory Visit
Admission: RE | Admit: 2024-10-03 | Discharge: 2024-10-03 | Disposition: A | Discharge status: Home / Self Care | Source: Ambulatory Visit | Attending: Otolaryngology | Admitting: Otolaryngology

## 2024-10-03 DIAGNOSIS — R490 Dysphonia: Secondary | ICD-10-CM | POA: Diagnosis present

## 2024-10-03 NOTE — Progress Notes (Addendum)
 Modified Barium Swallow Study  Patient Details  Name: Albert Burton MRN: [id number] Date of Birth: 1966-05-11  Today's Date: 10/03/2024  Modified Barium Swallow completed.  Full report located under Chart Review in the Imaging Section.  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 No reports of weight loss now change in diet. No noted pneumonia nor tx of such per chart. Pt reports he feels it's easy for me to get sick. Pt has recently been seen by ENT for Globus sensation, mostly impacted by solid foods as well as hoarseness, dry throat. He has been started on a PPI by the ENT and feels somewhat better. He endorses difficulty swallowing particulate, dry foods. Recent CT of Neck Imaging revealed No abnormality seen.  Last CXR was in 2018.   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 ANY Esophageal phase dysmotility can impact the oropharyngeal phase of swallowing.   Oral stage is characterized by adequate lip closure, bolus preparation and containment(except spillage of thin liquid trial over BOT 1x), and timely anterior to posterior transit. Swallow initiation occurs primarily at the level of the Valleculae w/ all trial consistencies but inconsistent spillage from the Valleculae w/ thin liquid noted x1-2 trials. Any trace, inconsistent lingual residue cleared w/ a f/u, Dry swallow b/t trials. Pharyngeal stage is noted for adequate tongue base retraction, adequate hyolaryngeal excursion, and adequate pharyngeal constriction. Pharyngeal  stripping wave is complete. Epiglottic inversion is complete w/ the all trials but 1x during this study when decreased timely/tight epiglottic inversion/closure occurred resulting in aspiration of thin liquid(trial). Pt responded w/ an immediate cough and cleared the majority of penetration/aspiration (slight coating remained briefly). This was an early trial during the study. Any slight-min oropharyngeal residue occurring cleared w/ his independent, Dry swallow and/or cued f/u, Dry swallow b/t trials. This was most noted to occur post consecutive dry solid trials.  Amplitude/duration of cricopharyngeus opening appeared [facility]. There was adequate clearance through the upper (viewable) cervical Esophagus w/ only min, brief bolus stasis noted intermittently. Noted min prominent PE segment and (primary) posterior wall protrusion(CP Bar) during the swallow.  A 13 mm barium tablet was given in Puree (for Education as an alternative to swallowing w/ thin liquid) for ease and safety of swallow/clearing -- it passed through the viewable pharyngoesophageal areas.   With the current REFLUX tx(PPI) and the min prominent appearing PE segment and CP Bar, pt was educated on the impact of REFLUX on the structures and on swallowing, including Globus sensations moreso w/ solids/foods, and he was given education on general aspiration precautions.  The results of the study were viewed/discussed immediately after. Questions answered. Factors that may increase risk of adverse event in presence of aspiration Albert Burton): GI disease; Appearance of Lordosis  Swallow Evaluation Recommendations Recommendations: PO diet PO Diet Recommendation: Regular; Thin liquids (Level 0) (small-cut, moistened foods) Liquid Administration via: Cup; No straw Medication Administration: Whole meds with puree (for ease, safety) Supervision: Patient able to self-feed Swallowing strategies: Minimize environmental distractions/talking at  meals; Slow rate; Small bites/sips; Follow solids with liquids; Dry swallow after each bite/sip Postural changes:  Position pt fully upright for meals; Stay upright 30-60 min after meals; Out of bed for meals (REFLUX precautions) Oral care recommendations: Oral care BID (2x/day); Pt independent with oral care Recommended consults: Consider GI consultation; Consider esophageal assessment and any f/u tx        Albert Portugal, MS, CCC-SLP Speech Language Pathologist Rehab Services; [facility] [phone] (ascom) Albert Burton 10/03/2024,4:56 PM
